# Patient Record
Sex: Male | Born: 1975 | Race: White | Hispanic: No | Smoking: Never smoker
Health system: Southern US, Community
[De-identification: ages and names within clinical notes are randomized; demographics above are authoritative.]

## PROBLEM LIST (undated history)

## (undated) DIAGNOSIS — F900 Attention-deficit hyperactivity disorder, predominantly inattentive type: Secondary | ICD-10-CM

## (undated) DIAGNOSIS — I10 Essential (primary) hypertension: Secondary | ICD-10-CM

## (undated) DIAGNOSIS — E785 Hyperlipidemia, unspecified: Secondary | ICD-10-CM

## (undated) DIAGNOSIS — E559 Vitamin D deficiency, unspecified: Secondary | ICD-10-CM

## (undated) DIAGNOSIS — K8020 Calculus of gallbladder without cholecystitis without obstruction: Secondary | ICD-10-CM

## (undated) HISTORY — DX: Essential (primary) hypertension: I10

## (undated) HISTORY — DX: Calculus of gallbladder without cholecystitis without obstruction: K80.20

## (undated) HISTORY — DX: Attention-deficit hyperactivity disorder, predominantly inattentive type: F90.0

## (undated) HISTORY — DX: Vitamin D deficiency, unspecified: E55.9

## (undated) HISTORY — DX: Hyperlipidemia, unspecified: E78.5

---

## 2010-11-11 ENCOUNTER — Encounter: Payer: Self-pay | Admitting: Internal Medicine

## 2010-11-11 ENCOUNTER — Ambulatory Visit (INDEPENDENT_AMBULATORY_CARE_PROVIDER_SITE_OTHER): Payer: BC Managed Care – PPO | Admitting: Internal Medicine

## 2010-11-11 ENCOUNTER — Other Ambulatory Visit: Payer: BC Managed Care – PPO

## 2010-11-11 ENCOUNTER — Other Ambulatory Visit: Payer: Self-pay | Admitting: Internal Medicine

## 2010-11-11 DIAGNOSIS — F411 Generalized anxiety disorder: Secondary | ICD-10-CM | POA: Insufficient documentation

## 2010-11-11 DIAGNOSIS — Z0000 Encounter for general adult medical examination without abnormal findings: Secondary | ICD-10-CM

## 2010-11-11 LAB — LIPID PANEL
HDL: 37.1 mg/dL — ABNORMAL LOW (ref >39.00)
LDL Cholesterol: 129 mg/dL — ABNORMAL HIGH (ref 0–99)
Total CHOL/HDL Ratio: 5
Triglycerides: 95 mg/dL (ref 0.0–149.0)
VLDL: 19 mg/dL (ref 0.0–40.0)

## 2010-11-12 ENCOUNTER — Encounter: Payer: Self-pay | Admitting: Internal Medicine

## 2010-11-12 DIAGNOSIS — F329 Major depressive disorder, single episode, unspecified: Secondary | ICD-10-CM | POA: Insufficient documentation

## 2010-11-12 DIAGNOSIS — F3289 Other specified depressive episodes: Secondary | ICD-10-CM | POA: Insufficient documentation

## 2010-11-17 NOTE — Assessment & Plan Note (Signed)
Summary: New Cpx/bcbs/--will fast 6 hrs prior--pt's request   Vital Signs:  Patient profile:   35 year old male Height:      73 inches Weight:      210 pounds BMI:     27.81 O2 Sat:      97 % on Room air Temp:     98.7 degrees F oral Pulse rate:   82 / minute Pulse rhythm:   regular Resp:     16 per minute BP sitting:   122 / 74  (left arm) Cuff size:   large  Vitals Entered By: Rock Nephew CMA (November 11, 2010 4:34 PM)  Nutrition Counseling: Patient's BMI is greater than 25 and therefore counseled on weight management options.  O2 Flow:  Room air  Primary Care Provider:  Etta Grandchild MD   History of Present Illness: New to me he comes in for a complete physical but also complains of anxiety and panic. He tells me that he is under a lot of stress at work and is separated from his wife.  Preventive Screening-Counseling & Management  Alcohol-Tobacco     Alcohol drinks/day: <1     Alcohol type: beer     >5/day in last 3 mos: no     Alcohol Counseling: not indicated; use of alcohol is not excessive or problematic     Feels need to cut down: no     Feels annoyed by complaints: no     Feels guilty re: drinking: no     Needs 'eye opener' in am: no     Smoking Status: never     Tobacco Counseling: not indicated; no tobacco use  Caffeine-Diet-Exercise     Does Patient Exercise: yes  Hep-HIV-STD-Contraception     Hepatitis Risk: no risk noted     HIV Risk: no risk noted     STD Risk: no risk noted     Dental Visit-last 6 months yes     Dental Care Counseling: to seek dental care; no dental care within six months     TSE monthly: yes     Testicular SE Education/Counseling to perform regular STE      Sexual History:  currently monogamous.        Drug Use:  no.        Blood Transfusions:  no.    Current Medications (verified): 1)  None  Allergies (verified): No Known Drug Allergies  Past History:  Past Medical History: Depression- previously took  Effexor  Past Surgical History: Tonsillectomy  Family History: Family History Hypertension  Social History: Occupation: Tourist information centre manager Divorced Never Smoked Alcohol use-yes Drug use-no Regular exercise-yes Smoking Status:  never Hepatitis Risk:  no risk noted HIV Risk:  no risk noted STD Risk:  no risk noted Dental Care w/in 6 mos.:  yes Sexual History:  currently monogamous Blood Transfusions:  no Drug Use:  no Does Patient Exercise:  yes  Review of Systems  The patient denies anorexia, fever, weight loss, weight gain, chest pain, syncope, dyspnea on exertion, peripheral edema, prolonged cough, headaches, hemoptysis, abdominal pain, hematuria, genital sores, suspicious skin lesions, transient blindness, difficulty walking, depression, unusual weight change, abnormal bleeding, enlarged lymph nodes, angioedema, and testicular masses.   Psych:  Complains of anxiety and panic attacks; denies alternate hallucination ( auditory/visual), depression, easily angered, easily tearful, irritability, mental problems, sense of great danger, suicidal thoughts/plans, thoughts of violence, unusual visions or sounds, and thoughts /plans of harming others.  Physical Exam  General:  alert, well-developed, well-nourished, well-hydrated, appropriate dress, normal appearance, healthy-appearing, cooperative to examination, and good hygiene.   Head:  normocephalic, atraumatic, no abnormalities observed, and no abnormalities palpated.   Eyes:  vision grossly intact, pupils equal, pupils round, and pupils reactive to light.   Ears:  R ear normal and L ear normal.   Nose:  External nasal examination shows no deformity or inflammation. Nasal mucosa are pink and moist without lesions or exudates. Mouth:  Oral mucosa and oropharynx without lesions or exudates.  Teeth in good repair. Neck:  No deformities, masses, or tenderness noted. Breasts:  No masses or gynecomastia noted Lungs:  Normal  respiratory effort, chest expands symmetrically. Lungs are clear to auscultation, no crackles or wheezes. Heart:  Normal rate and regular rhythm. S1 and S2 normal without gallop, murmur, click, rub or other extra sounds. Abdomen:  Bowel sounds positive,abdomen soft and non-tender without masses, organomegaly or hernias noted. Genitalia:  uncircumcised, no hydrocele, no varicocele, no scrotal masses, no testicular masses or atrophy, no cutaneous lesions, and no urethral discharge.   Msk:  No deformity or scoliosis noted of thoracic or lumbar spine.   Pulses:  R and L carotid,radial,femoral,dorsalis pedis and posterior tibial pulses are full and equal bilaterally Extremities:  No clubbing, cyanosis, edema, or deformity noted with normal full range of motion of all joints.   Neurologic:  No cranial nerve deficits noted. Station and gait are normal. Plantar reflexes are down-going bilaterally. DTRs are symmetrical throughout. Sensory, motor and coordinative functions appear intact. Skin:  turgor normal, color normal, no rashes, no suspicious lesions, no ecchymoses, no petechiae, no purpura, no ulcerations, no edema, and tattoo(s).   Cervical Nodes:  no anterior cervical adenopathy and no posterior cervical adenopathy.   Axillary Nodes:  no R axillary adenopathy and no L axillary adenopathy.   Inguinal Nodes:  no R inguinal adenopathy and no L inguinal adenopathy.   Psych:  Cognition and judgment appear intact. Alert and cooperative with normal attention span and concentration. No apparent delusions, illusions, hallucinations   Impression & Recommendations:  Problem # 1:  ROUTINE GENERAL MEDICAL EXAM@HEALTH  CARE FACL (ICD-V70.0) Assessment New  Orders: TLB-Lipid Panel (80061-LIPID)  Td Booster: Historical (03/13/2008)    Discussed using sunscreen, use of alcohol, drug use, self testicular exam, routine dental care, routine eye care, routine physical exam, seat belts, multiple vitamins,  and  recommendations for immunizations.  Discussed exercise and checking cholesterol.  Discussed gun safety, safe sex, and contraception.   Problem # 2:  OTHER ANXIETY STATES (ICD-300.09) Assessment: New  His updated medication list for this problem includes:    Klonopin 1 Mg Tabs (Clonazepam) ..... One by mouth two times a day as needed for panic/anxiety  Discussed medication use and relaxation techniques.   Complete Medication List: 1)  Klonopin 1 Mg Tabs (Clonazepam) .... One by mouth two times a day as needed for panic/anxiety  Patient Instructions: 1)  Please schedule a follow-up appointment in 1 month. 2)  It is important that you exercise regularly at least 20 minutes 5 times a week. If you develop chest pain, have severe difficulty breathing, or feel very tired , stop exercising immediately and seek medical attention. 3)  If you could be exposed to sexually transmitted diseases, you should use a condom. 4)  It is not healthy  for men to drink more than 2-3 drinks per day or for women to drink more than 1-2 drinks per day. Prescriptions: KLONOPIN 1 MG TABS (CLONAZEPAM)  One by mouth two times a day as needed for panic/anxiety  #35 x 2   Entered and Authorized by:   Etta Grandchild MD   Signed by:   Etta Grandchild MD on 11/11/2010   Method used:   Print then Give to Patient   RxID:   [id number]    Orders Added: 1)  TLB-Lipid Panel [80061-LIPID] 2)  New Patient 18-39 years [99385]   Immunization History:  Tetanus/Td Immunization History:    Tetanus/Td:  historical (03/13/2008)   Immunization History:  Tetanus/Td Immunization History:    Tetanus/Td:  Historical (03/13/2008)

## 2010-11-17 NOTE — Letter (Signed)
Summary: Lipid Letter  [facility]  [address] [place], [place] [address]   Phone: [phone]  Fax: [phone]    11/12/2010  Kyrese Gartman [address] [place], [place]  [address]  Dear Misty Stanley:  We have carefully reviewed your last lipid profile from 11/11/2010 and the results are noted below with a summary of recommendations for lipid management.    Cholesterol:       185     Goal: <200   HDL "good" Cholesterol:   02.72     Goal: >40   LDL "bad" Cholesterol:   129     Goal: <130   Triglycerides:       95.0     Goal: <150        TLC Diet (Therapeutic Lifestyle Change): Saturated Fats & Transfatty acids should be kept < 7% of total calories ***Reduce Saturated Fats Polyunstaurated Fat can be up to 10% of total calories Monounsaturated Fat Fat can be up to 20% of total calories Total Fat should be no greater than 25-35% of total calories Carbohydrates should be 50-60% of total calories Protein should be approximately 15% of total calories Fiber should be at least 20-30 grams a day ***Increased fiber may help lower LDL Total Cholesterol should be < 200mg /day Consider adding plant stanol/sterols to diet (example: Benacol spread) ***A higher intake of unsaturated fat may reduce Triglycerides and Increase HDL    Adjunctive Measures (may lower LIPIDS and reduce risk of Heart Attack) include: Aerobic Exercise (20-30 minutes 3-4 times a week) Limit Alcohol Consumption Weight Reduction Aspirin 75-81 mg a day by mouth (if not allergic or contraindicated) Dietary Fiber 20-30 grams a day by mouth     Current Medications: 1)    Klonopin 1 Mg Tabs (Clonazepam) .... One by mouth two times a day as needed for panic/anxiety  If you have any questions, please call. We appreciate being able to work with you.   Sincerely,    [facility] Etta Grandchild MD

## 2013-08-02 ENCOUNTER — Ambulatory Visit (INDEPENDENT_AMBULATORY_CARE_PROVIDER_SITE_OTHER): Payer: BC Managed Care – PPO | Admitting: Internal Medicine

## 2013-08-02 ENCOUNTER — Other Ambulatory Visit (INDEPENDENT_AMBULATORY_CARE_PROVIDER_SITE_OTHER): Payer: BC Managed Care – PPO

## 2013-08-02 ENCOUNTER — Encounter: Payer: Self-pay | Admitting: Internal Medicine

## 2013-08-02 VITALS — BP 136/98 | HR 88 | Temp 98.2°F | Resp 16 | Ht 73.0 in | Wt 221.0 lb

## 2013-08-02 DIAGNOSIS — Z0000 Encounter for general adult medical examination without abnormal findings: Secondary | ICD-10-CM

## 2013-08-02 DIAGNOSIS — Z1211 Encounter for screening for malignant neoplasm of colon: Secondary | ICD-10-CM | POA: Insufficient documentation

## 2013-08-02 DIAGNOSIS — F988 Other specified behavioral and emotional disorders with onset usually occurring in childhood and adolescence: Secondary | ICD-10-CM

## 2013-08-02 DIAGNOSIS — Z23 Encounter for immunization: Secondary | ICD-10-CM

## 2013-08-02 DIAGNOSIS — I10 Essential (primary) hypertension: Secondary | ICD-10-CM | POA: Insufficient documentation

## 2013-08-02 DIAGNOSIS — K5900 Constipation, unspecified: Secondary | ICD-10-CM

## 2013-08-02 DIAGNOSIS — F900 Attention-deficit hyperactivity disorder, predominantly inattentive type: Secondary | ICD-10-CM

## 2013-08-02 HISTORY — DX: Essential (primary) hypertension: I10

## 2013-08-02 HISTORY — DX: Attention-deficit hyperactivity disorder, predominantly inattentive type: F90.0

## 2013-08-02 LAB — LIPID PANEL
Cholesterol: 216 mg/dL — ABNORMAL HIGH (ref 0–200)
VLDL: 24 mg/dL (ref 0.0–40.0)

## 2013-08-02 LAB — COMPREHENSIVE METABOLIC PANEL
AST: 16 U/L (ref 0–37)
Albumin: 4.5 g/dL (ref 3.5–5.2)
BUN: 12 mg/dL (ref 6–23)
CO2: 26 mEq/L (ref 19–32)
Calcium: 9.5 mg/dL (ref 8.4–10.5)
Chloride: 102 mEq/L (ref 96–112)
Creatinine, Ser: 1.1 mg/dL (ref 0.4–1.5)
GFR: 83.61 mL/min (ref >60.00)
Potassium: 4.1 mEq/L (ref 3.5–5.1)

## 2013-08-02 LAB — URINALYSIS, ROUTINE W REFLEX MICROSCOPIC
Hgb urine dipstick: NEGATIVE
Leukocytes, UA: NEGATIVE
Nitrite: NEGATIVE
RBC / HPF: NONE SEEN (ref 0–?)
Total Protein, Urine: NEGATIVE
Urine Glucose: NEGATIVE
pH: 7 (ref 5.0–8.0)

## 2013-08-02 LAB — CBC WITH DIFFERENTIAL/PLATELET
Basophils Relative: 0.3 % (ref 0.0–3.0)
Eosinophils Absolute: 0.4 10*3/uL (ref 0.0–0.7)
Hemoglobin: 16.2 g/dL (ref 13.0–17.0)
Lymphocytes Relative: 33.9 % (ref 12.0–46.0)
MCHC: 33.8 g/dL (ref 30.0–36.0)
MCV: 89.7 fl (ref 78.0–100.0)
Neutro Abs: 4.5 10*3/uL (ref 1.4–7.7)
RBC: 5.34 Mil/uL (ref 4.22–5.81)

## 2013-08-02 LAB — LDL CHOLESTEROL, DIRECT: Direct LDL: 173 mg/dL

## 2013-08-02 MED ORDER — LINACLOTIDE 145 MCG PO CAPS: 145.0000 ug | ORAL_CAPSULE | Freq: Every day | ORAL | Status: DC

## 2013-08-02 MED ORDER — LISDEXAMFETAMINE DIMESYLATE 40 MG PO CAPS: 40.0000 mg | ORAL_CAPSULE | ORAL | Status: DC

## 2013-08-02 NOTE — Progress Notes (Signed)
Pre visit review using our clinic review tool, if applicable. No additional management support is needed unless otherwise documented below in the visit note. 

## 2013-08-02 NOTE — Patient Instructions (Signed)
Constipation, Adult Constipation is when a person has fewer than 3 bowel movements a week; has difficulty having a bowel movement; or has stools that are dry, hard, or larger than normal. As people grow older, constipation is more common. If you try to fix constipation with medicines that make you have a bowel movement (laxatives), the problem may get worse. Long-term laxative use may cause the muscles of the colon to become weak. A low-fiber diet, not taking in enough fluids, and taking certain medicines may make constipation worse. CAUSES   Certain medicines, such as antidepressants, pain medicine, iron supplements, antacids, and water pills.   Certain diseases, such as diabetes, irritable bowel syndrome (IBS), thyroid disease, or depression.   Not drinking enough water.   Not eating enough fiber-rich foods.   Stress or travel.  Lack of physical activity or exercise.  Not going to the restroom when there is the urge to have a bowel movement.  Ignoring the urge to have a bowel movement.  Using laxatives too much. SYMPTOMS   Having fewer than 3 bowel movements a week.   Straining to have a bowel movement.   Having hard, dry, or larger than normal stools.   Feeling full or bloated.   Pain in the lower abdomen.  Not feeling relief after having a bowel movement. DIAGNOSIS  Your caregiver will take a medical history and perform a physical exam. Further testing may be done for severe constipation. Some tests may include:   A barium enema X-ray to examine your rectum, colon, and sometimes, your small intestine.  A sigmoidoscopy to examine your lower colon.  A colonoscopy to examine your entire colon. TREATMENT  Treatment will depend on the severity of your constipation and what is causing it. Some dietary treatments include drinking more fluids and eating more fiber-rich foods. Lifestyle treatments may include regular exercise. If these diet and lifestyle recommendations  do not help, your caregiver may recommend taking over-the-counter laxative medicines to help you have bowel movements. Prescription medicines may be prescribed if over-the-counter medicines do not work.  HOME CARE INSTRUCTIONS   Increase dietary fiber in your diet, such as fruits, vegetables, whole grains, and beans. Limit high-fat and processed sugars in your diet, such as [place] fries, hamburgers, cookies, candies, and soda.   A fiber supplement may be added to your diet if you cannot get enough fiber from foods.   Drink enough fluids to keep your urine clear or pale yellow.   Exercise regularly or as directed by your caregiver.   Go to the restroom when you have the urge to go. Do not hold it.  Only take medicines as directed by your caregiver. Do not take other medicines for constipation without talking to your caregiver first. SEEK IMMEDIATE MEDICAL CARE IF:   You have bright red blood in your stool.   Your constipation lasts for more than 4 days or gets worse.   You have abdominal or rectal pain.   You have thin, pencil-like stools.  You have unexplained weight loss. MAKE SURE YOU:   Understand these instructions.  Will watch your condition.  Will get help right away if you are not doing well or get worse. Document Released: 05/14/2004 Document Revised: 11/08/2011 Document Reviewed: 07/20/2011 [facility] Patient Information 2014 [place], [place]. Health Maintenance, Males A healthy lifestyle and preventative care can promote health and wellness.  Maintain regular health, dental, and eye exams.  Eat a healthy diet. Foods like vegetables, fruits, whole grains, low-fat dairy products,  and lean protein foods contain the nutrients you need without too many calories. Decrease your intake of foods high in solid fats, added sugars, and salt. Get information about a proper diet from your caregiver, if necessary.  Regular physical exercise is one of the most important things  you can do for your health. Most adults should get at least 150 minutes of moderate-intensity exercise (any activity that increases your heart rate and causes you to sweat) each week. In addition, most adults need muscle-strengthening exercises on 2 or more days a week.   Maintain a healthy weight. The body mass index (BMI) is a screening tool to identify possible weight problems. It provides an estimate of body fat based on height and weight. Your caregiver can help determine your BMI, and can help you achieve or maintain a healthy weight. For adults 20 years and older:  A BMI below 18.5 is considered underweight.  A BMI of 18.5 to 24.9 is normal.  A BMI of 25 to 29.9 is considered overweight.  A BMI of 30 and above is considered obese.  Maintain normal blood lipids and cholesterol by exercising and minimizing your intake of saturated fat. Eat a balanced diet with plenty of fruits and vegetables. Blood tests for lipids and cholesterol should begin at age 28 and be repeated every 5 years. If your lipid or cholesterol levels are high, you are over 50, or you are a high risk for heart disease, you may need your cholesterol levels checked more frequently.Ongoing high lipid and cholesterol levels should be treated with medicines, if diet and exercise are not effective.  If you smoke, find out from your caregiver how to quit. If you do not use tobacco, do not start.  Lung cancer screening is recommended for adults aged 54 80 years who are at high risk for developing lung cancer because of a history of smoking. Yearly low-dose computed tomography (CT) is recommended for people who have at least a 30-pack-year history of smoking and are a current smoker or have quit within the past 15 years. A pack year of smoking is smoking an average of 1 pack of cigarettes a day for 1 year (for example: 1 pack a day for 30 years or 2 packs a day for 15 years). Yearly screening should continue until the smoker has  stopped smoking for at least 15 years. Yearly screening should also be stopped for people who develop a health problem that would prevent them from having lung cancer treatment.  If you choose to drink alcohol, do not exceed 2 drinks per day. One drink is considered to be 12 ounces (355 mL) of beer, 5 ounces (148 mL) of wine, or 1.5 ounces (44 mL) of liquor.  Avoid use of street drugs. Do not share needles with anyone. Ask for help if you need support or instructions about stopping the use of drugs.  High blood pressure causes heart disease and increases the risk of stroke. Blood pressure should be checked at least every 1 to 2 years. Ongoing high blood pressure should be treated with medicines if weight loss and exercise are not effective.  If you are 11 to 36 years old, ask your caregiver if you should take aspirin to prevent heart disease.  Diabetes screening involves taking a blood sample to check your fasting blood sugar level. This should be done once every 3 years, after age 36, if you are within normal weight and without risk factors for diabetes. Testing should be considered  at a younger age or be carried out more frequently if you are overweight and have at least 1 risk factor for diabetes.  Colorectal cancer can be detected and often prevented. Most routine colorectal cancer screening begins at the age of 60 and continues through age 40. However, your caregiver may recommend screening at an earlier age if you have risk factors for colon cancer. On a yearly basis, your caregiver may provide home test kits to check for hidden blood in the stool. Use of a small camera at the end of a tube, to directly examine the colon (sigmoidoscopy or colonoscopy), can detect the earliest forms of colorectal cancer. Talk to your caregiver about this at age 83, when routine screening begins. Direct examination of the colon should be repeated every 5 to 10 years through age 45, unless early forms of pre-cancerous  polyps or small growths are found.  Hepatitis C blood testing is recommended for all people born from [birth year] through 1964 and any individual with known risks for hepatitis C.  Healthy men should no longer receive prostate-specific antigen (PSA) blood tests as part of routine cancer screening. Consult with your caregiver about prostate cancer screening.  Testicular cancer screening is not recommended for adolescents or adult males who have no symptoms. Screening includes self-exam, caregiver exam, and other screening tests. Consult with your caregiver about any symptoms you have or any concerns you have about testicular cancer.  Practice safe sex. Use condoms and avoid high-risk sexual practices to reduce the spread of sexually transmitted infections (STIs).  Use sunscreen. Apply sunscreen liberally and repeatedly throughout the day. You should seek shade when your shadow is shorter than you. Protect yourself by wearing long sleeves, pants, a wide-brimmed hat, and sunglasses year round, whenever you are outdoors.  Notify your caregiver of new moles or changes in moles, especially if there is a change in shape or color. Also notify your caregiver if a mole is larger than the size of a pencil eraser.  A one-time screening for abdominal aortic aneurysm (AAA) and surgical repair of large AAAs by sound wave imaging (ultrasonography) is recommended for ages 52 to 56 years who are current or former smokers.  Stay current with your immunizations. Document Released: 02/12/2008 Document Revised: 12/11/2012 Document Reviewed: 01/11/2011 [facility] Patient Information 2014 [place], [place].

## 2013-08-03 NOTE — Assessment & Plan Note (Signed)
He has taken vyvanse in the past with good response Will try it again

## 2013-08-03 NOTE — Assessment & Plan Note (Signed)
Exam done Vaccines were updated Labs ordered Pt ed material was given 

## 2013-08-03 NOTE — Assessment & Plan Note (Signed)
His BP is borderline elevated  He will work on his lifestyle modifications

## 2013-08-03 NOTE — Assessment & Plan Note (Signed)
This sounds like idiopathic constipation I will check his labs today to look for secondary causes He will try linzess for symptom relief

## 2013-08-03 NOTE — Progress Notes (Signed)
   Subjective:    Patient ID: Leon Carroll, male    DOB: 1975/11/21, 37 y.o.   MRN: [id number]  Constipation This is a chronic problem. The current episode started more than 1 year ago ("all my life"). The problem is unchanged. His stool frequency is 2 to 3 times per week. The stool is described as firm and formed. The patient is on a high fiber diet. He exercises regularly. There has been adequate water intake. Pertinent negatives include no abdominal pain, anorexia, back pain, bloating, diarrhea, difficulty urinating, fecal incontinence, fever, flatus, hematochezia, hemorrhoids, melena, nausea, rectal pain, vomiting or weight loss. He has tried laxatives for the symptoms. The treatment provided mild relief.      Review of Systems  Constitutional: Negative.  Negative for fever, chills, weight loss, diaphoresis, activity change, appetite change, fatigue and unexpected weight change.  HENT: Negative.   Eyes: Negative.   Respiratory: Negative.  Negative for apnea, cough, choking, chest tightness, shortness of breath, wheezing and stridor.   Cardiovascular: Negative.  Negative for chest pain, palpitations and leg swelling.  Gastrointestinal: Positive for constipation. Negative for nausea, vomiting, abdominal pain, diarrhea, blood in stool, melena, hematochezia, abdominal distention, anal bleeding, rectal pain, bloating, anorexia, flatus and hemorrhoids.  Endocrine: Negative.   Genitourinary: Negative.  Negative for difficulty urinating.  Musculoskeletal: Negative.  Negative for back pain.  Skin: Negative.   Allergic/Immunologic: Negative.   Neurological: Negative.   Hematological: Negative.  Negative for adenopathy. Does not bruise/bleed easily.  Psychiatric/Behavioral: Negative.        Objective:   Physical Exam  Vitals reviewed. Constitutional: He is oriented to person, place, and time. He appears well-developed and well-nourished. No distress.  HENT:  Head: Normocephalic and  atraumatic.  Mouth/Throat: Oropharynx is clear and moist. No oropharyngeal exudate.  Eyes: Conjunctivae are normal. Right eye exhibits no discharge. Left eye exhibits no discharge. No scleral icterus.  Neck: Normal range of motion. Neck supple. No JVD present. No tracheal deviation present. No thyromegaly present.  Cardiovascular: Normal rate, regular rhythm, normal heart sounds and intact distal pulses.  Exam reveals no gallop and no friction rub.   No murmur heard. Pulmonary/Chest: Effort normal and breath sounds normal. No stridor. No respiratory distress. He has no wheezes. He has no rales. He exhibits no tenderness.  Abdominal: Soft. Bowel sounds are normal. He exhibits no distension and no mass. There is no tenderness. There is no rebound and no guarding. Hernia confirmed negative in the right inguinal area and confirmed negative in the left inguinal area.  Genitourinary: Testes normal and penis normal. Right testis shows no mass, no swelling and no tenderness. Right testis is descended. Left testis shows no mass, no swelling and no tenderness. Left testis is descended. Circumcised. No penile erythema or penile tenderness. No discharge found.  Musculoskeletal: Normal range of motion. He exhibits no edema and no tenderness.  Lymphadenopathy:    He has no cervical adenopathy.       Right: No inguinal adenopathy present.       Left: No inguinal adenopathy present.  Neurological: He is oriented to person, place, and time.  Skin: Skin is warm and dry. No rash noted. He is not diaphoretic. No erythema. No pallor.  Psychiatric: He has a normal mood and affect. His behavior is normal. Judgment and thought content normal.          Assessment & Plan:

## 2013-09-20 ENCOUNTER — Telehealth: Payer: Self-pay | Admitting: Internal Medicine

## 2013-09-20 DIAGNOSIS — F900 Attention-deficit hyperactivity disorder, predominantly inattentive type: Secondary | ICD-10-CM

## 2013-09-20 MED ORDER — LISDEXAMFETAMINE DIMESYLATE 40 MG PO CAPS: 40.0000 mg | ORAL_CAPSULE | ORAL | Status: DC

## 2013-09-20 NOTE — Telephone Encounter (Signed)
done

## 2013-09-20 NOTE — Telephone Encounter (Signed)
Patient called stating he need a refill on lisdexamfetamine (VYVANSE) 40 MG capsule Please advise

## 2013-09-27 NOTE — Telephone Encounter (Signed)
Pt called to check up on med request. Pt aware, med is ready be pick.

## 2013-10-09 ENCOUNTER — Encounter: Payer: Self-pay | Admitting: Internal Medicine

## 2013-11-02 ENCOUNTER — Encounter: Payer: Self-pay | Admitting: Internal Medicine

## 2013-11-13 ENCOUNTER — Telehealth: Payer: Self-pay | Admitting: Internal Medicine

## 2013-11-13 NOTE — Telephone Encounter (Signed)
Patient is calling to request a refill on his Vyvanse prescription. He is also wanting to know if he can have a prescription for Hydroxyzine to help him sleep. States that the Vyvanse keeps him up all night and he was prescribed Hydroxyzine a few years ago and just recently started to take it and it worked well with helping him sleep while taking Vyvanse. Please advise.

## 2013-11-14 NOTE — Telephone Encounter (Signed)
Pt need an appointment in order to discuss concerns

## 2013-11-14 NOTE — Telephone Encounter (Signed)
Left vm for pt to call and schedule an appt with Dr. Jones.  °

## 2013-11-14 NOTE — Telephone Encounter (Signed)
Patient scheduled for tomorrow

## 2013-11-15 ENCOUNTER — Encounter: Payer: Self-pay | Admitting: Internal Medicine

## 2013-11-15 ENCOUNTER — Ambulatory Visit (INDEPENDENT_AMBULATORY_CARE_PROVIDER_SITE_OTHER): Payer: BC Managed Care – PPO | Admitting: Internal Medicine

## 2013-11-15 VITALS — BP 118/82 | HR 78 | Temp 98.4°F | Resp 16 | Ht 73.0 in | Wt 223.2 lb

## 2013-11-15 DIAGNOSIS — G4700 Insomnia, unspecified: Secondary | ICD-10-CM

## 2013-11-15 DIAGNOSIS — F988 Other specified behavioral and emotional disorders with onset usually occurring in childhood and adolescence: Secondary | ICD-10-CM

## 2013-11-15 DIAGNOSIS — F900 Attention-deficit hyperactivity disorder, predominantly inattentive type: Secondary | ICD-10-CM

## 2013-11-15 MED ORDER — LISDEXAMFETAMINE DIMESYLATE 40 MG PO CAPS: 40.0000 mg | ORAL_CAPSULE | ORAL | Status: DC

## 2013-11-15 MED ORDER — HYDROXYZINE HCL 50 MG PO TABS: 50.0000 mg | ORAL_TABLET | Freq: Every evening | ORAL | Status: DC | PRN

## 2013-11-15 NOTE — Progress Notes (Signed)
   Subjective:    Patient ID: Leon LibelJames Stacey Courtois, male    DOB: 04/05/1976, 38 y.o.   MRN: [id number]  HPI Comments: He returns for f/up on ADHD - he is doing rather well though he does have difficulty with focus, project completion, and productivity at work if he misses a dose. Also, he occasionally has trouble falling asleep if he takes the dose later than 9 AM but he has some hydroxyzine that he has taken for that and it helps him fall asleep.     Review of Systems  Constitutional: Negative.  Negative for fever, chills, diaphoresis, appetite change and fatigue.  HENT: Negative.   Eyes: Negative.   Respiratory: Negative.  Negative for cough, choking, chest tightness, shortness of breath, wheezing and stridor.   Cardiovascular: Negative.  Negative for chest pain, palpitations and leg swelling.  Gastrointestinal: Negative.  Negative for nausea, vomiting, abdominal pain, diarrhea, constipation and blood in stool.  Endocrine: Negative.   Genitourinary: Negative.   Musculoskeletal: Negative.   Skin: Negative.   Allergic/Immunologic: Negative.   Neurological: Negative.  Negative for dizziness, tremors, weakness and light-headedness.  Hematological: Negative.  Negative for adenopathy. Does not bruise/bleed easily.  Psychiatric/Behavioral: Positive for sleep disturbance and decreased concentration. Negative for suicidal ideas, hallucinations, behavioral problems, confusion, self-injury, dysphoric mood and agitation. The patient is not nervous/anxious and is not hyperactive.        Objective:   Physical Exam  Vitals reviewed. Constitutional: He is oriented to person, place, and time. He appears well-developed and well-nourished. No distress.  HENT:  Head: Normocephalic and atraumatic.  Mouth/Throat: Oropharynx is clear and moist. No oropharyngeal exudate.  Eyes: Conjunctivae are normal. Right eye exhibits no discharge. Left eye exhibits no discharge. No scleral icterus.  Neck: Normal range  of motion. Neck supple. No JVD present. No tracheal deviation present. No thyromegaly present.  Cardiovascular: Normal rate, regular rhythm and intact distal pulses.  Exam reveals no gallop and no friction rub.   No murmur heard. Pulmonary/Chest: Effort normal and breath sounds normal. No stridor. No respiratory distress. He has no wheezes. He has no rales. He exhibits no tenderness.  Abdominal: Soft. Bowel sounds are normal. He exhibits no distension and no mass. There is no tenderness. There is no rebound and no guarding.  Musculoskeletal: Normal range of motion. He exhibits no edema and no tenderness.  Lymphadenopathy:    He has no cervical adenopathy.  Neurological: He is oriented to person, place, and time.  Skin: Skin is warm and dry. No rash noted. He is not diaphoretic. No erythema. No pallor.  Psychiatric: He has a normal mood and affect. His behavior is normal. Judgment and thought content normal.     Lab Results  Component Value Date   WBC 8.1 08/02/2013   HGB 16.2 08/02/2013   HCT 47.9 08/02/2013   PLT 245.0 08/02/2013   GLUCOSE 87 08/02/2013   CHOL 216* 08/02/2013   TRIG 120.0 08/02/2013   HDL 34.50* 08/02/2013   LDLDIRECT 173.0 08/02/2013   LDLCALC 129* 11/11/2010   ALT 13 08/02/2013   AST 16 08/02/2013   NA 136 08/02/2013   K 4.1 08/02/2013   CL 102 08/02/2013   CREATININE 1.1 08/02/2013   BUN 12 08/02/2013   CO2 26 08/02/2013   TSH 1.72 08/02/2013        Assessment & Plan:

## 2013-11-15 NOTE — Progress Notes (Signed)
Pre visit review using our clinic review tool, if applicable. No additional management support is needed unless otherwise documented below in the visit note. 

## 2013-11-15 NOTE — Patient Instructions (Signed)
Attention Deficit Hyperactivity Disorder Attention deficit hyperactivity disorder (ADHD) is a problem with behavior issues based on the way the brain functions (neurobehavioral disorder). It is a common reason for behavior and academic problems in school. SYMPTOMS  There are 3 types of ADHD. The 3 types and some of the symptoms include:  Inattentive  Gets bored or distracted easily.  Loses or forgets things. Forgets to hand in homework.  Has trouble organizing or completing tasks.  Difficulty staying on task.  An inability to organize daily tasks and school work.  Leaving projects, chores, or homework unfinished.  Trouble paying attention or responding to details. Careless mistakes.  Difficulty following directions. Often seems like is not listening.  Dislikes activities that require sustained attention (like chores or homework).  Hyperactive-impulsive  Feels like it is impossible to sit still or stay in a seat. Fidgeting with hands and feet.  Trouble waiting turn.  Talking too much or out of turn. Interruptive.  Speaks or acts impulsively.  Aggressive, disruptive behavior.  Constantly busy or on the go, noisy.  Often leaves seat when they are expected to remain seated.  Often runs or climbs where it is not appropriate, or feels very restless.  Combined  Has symptoms of both of the above. Often children with ADHD feel discouraged about themselves and with school. They often perform well below their abilities in school. As children get older, the excess motor activities can calm down, but the problems with paying attention and staying organized persist. Most children do not outgrow ADHD but with good treatment can learn to cope with the symptoms. DIAGNOSIS  When ADHD is suspected, the diagnosis should be made by professionals trained in ADHD. This professional will collect information about the individual suspected of having ADHD. Information must be collected from  various settings where the person lives, works, or attends school.  Diagnosis will include:  Confirming symptoms began in childhood.  Ruling out other reasons for the child's behavior.  The health care providers will check with the child's school and check their medical records.  They will talk to teachers and parents.  Behavior rating scales for the child will be filled out by those dealing with the child on a daily basis. A diagnosis is made only after all information has been considered. TREATMENT  Treatment usually includes behavioral treatment, tutoring or extra support in school, and stimulant medicines. Because of the way a person's brain works with ADHD, these medicines decrease impulsivity and hyperactivity and increase attention. This is different than how they would work in a person who does not have ADHD. Other medicines used include antidepressants and certain blood pressure medicines. Most experts agree that treatment for ADHD should address all aspects of the person's functioning. Along with medicines, treatment should include structured classroom management at school. Parents should reward good behavior, provide constant discipline, and limit-setting. Tutoring should be available for the child as needed. ADHD is a life-long condition. If untreated, the disorder can have long-term serious effects into adolescence and adulthood. HOME CARE INSTRUCTIONS   Often with ADHD there is a lot of frustration among family members dealing with the condition. Blame and anger are also feelings that are common. In many cases, because the problem affects the family as a whole, the entire family may need help. A therapist can help the family find better ways to handle the disruptive behaviors of the person with ADHD and promote change. If the person with ADHD is young, most of the therapist's work   is with the parents. Parents will learn techniques for coping with and improving their child's  behavior. Sometimes only the child with the ADHD needs counseling. Your health care providers can help you make these decisions.  Children with ADHD may need help learning how to organize. Some helpful tips include:  Keep routines the same every day from wake-up time to bedtime. Schedule all activities, including homework and playtime. Keep the schedule in a place where the person with ADHD will often see it. Mark schedule changes as far in advance as possible.  Schedule outdoor and indoor recreation.  Have a place for everything and keep everything in its place. This includes clothing, backpacks, and school supplies.  Encourage writing down assignments and bringing home needed books. Work with your child's teachers for assistance in organizing school work.  Offer your child a well-balanced diet. Breakfast that includes a balance of whole grains, protein and, fruits or vegetables is especially important for school performance. Children should avoid drinks with caffeine including:  Soft drinks.  Coffee.  Tea.  However, some older children (adolescents) may find these drinks helpful in improving their attention. Because it can also be common for adolescents with ADHD to become addicted to caffeine, talk with your health care provider about what is a safe amount of caffeine intake for your child.  Children with ADHD need consistent rules that they can understand and follow. If rules are followed, give small rewards. Children with ADHD often receive, and expect, criticism. Look for good behavior and praise it. Set realistic goals. Give clear instructions. Look for activities that can foster success and self-esteem. Make time for pleasant activities with your child. Give lots of affection.  Parents are their children's greatest advocates. Learn as much as possible about ADHD. This helps you become a stronger and better advocate for your child. It also helps you educate your child's teachers and  instructors if they feel inadequate in these areas. Parent support groups are often helpful. A national group with local chapters is called Children and Adults with Attention Deficit Hyperactivity Disorder (CHADD). SEEK MEDICAL CARE IF:  Your child has repeated muscle twitches, cough or speech outbursts.  Your child has sleep problems.  Your child has a marked loss of appetite.  Your child develops depression.  Your child has new or worsening behavioral problems.  Your child develops dizziness.  Your child has a racing heart.  Your child has stomach pains.  Your child develops headaches. SEEK IMMEDIATE MEDICAL CARE IF:  Your child has been diagnosed with depression or anxiety and the symptoms seem to be getting worse.  Your child has been depressed and suddenly appears to have increased energy or motivation.  You are worried that your child is having a bad reaction to a medication he or she is taking for ADHD. Document Released: 08/06/2002 Document Revised: 06/06/2013 Document Reviewed: 04/23/2013 [employer] Patient Information 2014 [employer].  

## 2013-11-16 ENCOUNTER — Encounter: Payer: Self-pay | Admitting: Internal Medicine

## 2013-11-16 NOTE — Assessment & Plan Note (Signed)
Will cont vyvanse at the current dose

## 2013-11-16 NOTE — Assessment & Plan Note (Signed)
Cont hydroxyzine as needed for insomnia

## 2014-04-29 ENCOUNTER — Ambulatory Visit: Payer: BC Managed Care – PPO | Admitting: Internal Medicine

## 2014-05-01 ENCOUNTER — Ambulatory Visit (INDEPENDENT_AMBULATORY_CARE_PROVIDER_SITE_OTHER): Payer: BC Managed Care – PPO | Admitting: Internal Medicine

## 2014-05-01 ENCOUNTER — Ambulatory Visit (INDEPENDENT_AMBULATORY_CARE_PROVIDER_SITE_OTHER)
Admission: RE | Admit: 2014-05-01 | Discharge: 2014-05-01 | Disposition: A | Discharge status: Home / Self Care | Payer: BC Managed Care – PPO | Source: Ambulatory Visit | Attending: Internal Medicine | Admitting: Internal Medicine

## 2014-05-01 ENCOUNTER — Encounter: Payer: Self-pay | Admitting: Internal Medicine

## 2014-05-01 VITALS — BP 120/80 | HR 73 | Temp 98.2°F | Wt 226.2 lb

## 2014-05-01 DIAGNOSIS — M545 Low back pain, unspecified: Secondary | ICD-10-CM

## 2014-05-01 MED ORDER — CYCLOBENZAPRINE HCL 5 MG PO TABS: ORAL_TABLET | ORAL | Status: DC

## 2014-05-01 MED ORDER — TRAMADOL HCL 50 MG PO TABS: 50.0000 mg | ORAL_TABLET | Freq: Three times a day (TID) | ORAL | Status: DC | PRN

## 2014-05-01 NOTE — Patient Instructions (Signed)
The best exercises for the low back include freestyle swimming, stretch aerobics, and yoga.Cybex & Nautilus machines rather than dead weights are better for the back.  Eat a low-fat diet with lots of fruits and vegetables, up to 7-9 servings per day. Consume less than 40 Grams (preferably ZERO) of sugar per day from foods & drinks with High Fructose Corn Syrup (HFCS) sugar as #1,2,3 or # 4 on label.Whole Foods, [employer] & [employer] do not carry products with HFCS.

## 2014-05-01 NOTE — Progress Notes (Signed)
Pre visit review using our clinic review tool, if applicable. No additional management support is needed unless otherwise documented below in the visit note. 

## 2014-05-01 NOTE — Progress Notes (Signed)
   Subjective:    Patient ID: Leon Carroll, male    DOB: 1976-01-13, 38 y.o.   MRN: [id number]  HPI  He has had upper lumbar area back pain for 8 weeks. There was no injury, trigger, repetitive motion prior to onset of symptoms  It's in the mid lumbar spine that radiates bilaterally anteriorly with equal intensity  It is worse when supine and improved standing.  He does not find it is aggravated by playing tennis  Aleve and heat have been of only minimal benefit    Review of Systems Fever, chills, sweats, or unexplained weight loss not present. Blurred vision , diplopia or vision loss absent. Vertigo, near syncope or imbalance denied. There is no numbness, tingling, or weakness in extremities.  No loss of control of bladder or bowels.  He is concerned about central weight gain. He does drink high fructose corn syrup sugar-containing beverages frequently      Objective:   Physical Exam  Pertinent positive findings include: He is tender to light percussion over the upper lumbar spine. He is able to lie flat and sit up without help. He has negative straight leg raising. Deep tendon reflexes, strength, and tone are all normal. Heel and toe gait normal.  General appearance :adequately nourished; in no distress. Eyes: No conjunctival inflammation or scleral icterus is present. Heart:  Normal rate and regular rhythm. S1 and S2 normal without gallop, murmur, click, rub or other extra sounds   Lungs:Chest clear to auscultation; no wheezes, rhonchi,rales ,or rubs present.No increased work of breathing.  Abdomen: bowel sounds normal, soft and non-tender without masses, organomegaly or hernias noted.  No guarding or rebound.  Skin:Warm & dry.  Intact without suspicious lesions or rashes ; no jaundice or tenting Lymphatic: No lymphadenopathy is noted about the head, neck, axilla.           Assessment & Plan:  #1 lumbar pain with radiculopathy bilaterally   Plan: See  orders and recommendations

## 2014-05-17 ENCOUNTER — Other Ambulatory Visit: Payer: Self-pay | Admitting: Internal Medicine

## 2014-05-17 DIAGNOSIS — M545 Low back pain, unspecified: Secondary | ICD-10-CM

## 2014-05-17 MED ORDER — CYCLOBENZAPRINE HCL 5 MG PO TABS: ORAL_TABLET | ORAL | Status: DC

## 2014-05-17 NOTE — Telephone Encounter (Signed)
#  10 1-2 qhs prn  If symptoms are persisting or progressing, further evaluation is needed rather than simply taking muscle relaxant

## 2014-05-17 NOTE — Telephone Encounter (Signed)
Patient states pharmacy is sending over a request for flexeril.  Patient will be out over the weekend so he would like something sent today.  [facility] uses [facility].

## 2014-05-17 NOTE — Telephone Encounter (Signed)
Received refill request for cyclobenzaprine 5 mg, do you want to refill? Please advise

## 2014-06-27 ENCOUNTER — Ambulatory Visit: Payer: BC Managed Care – PPO | Admitting: Internal Medicine

## 2014-07-01 ENCOUNTER — Ambulatory Visit (INDEPENDENT_AMBULATORY_CARE_PROVIDER_SITE_OTHER)
Admission: RE | Admit: 2014-07-01 | Discharge: 2014-07-01 | Disposition: A | Discharge status: Home / Self Care | Payer: BC Managed Care – PPO | Source: Ambulatory Visit | Attending: Family | Admitting: Family

## 2014-07-01 ENCOUNTER — Telehealth: Payer: Self-pay | Admitting: Family

## 2014-07-01 ENCOUNTER — Ambulatory Visit (INDEPENDENT_AMBULATORY_CARE_PROVIDER_SITE_OTHER): Payer: BC Managed Care – PPO | Admitting: Family

## 2014-07-01 ENCOUNTER — Encounter: Payer: Self-pay | Admitting: Family

## 2014-07-01 VITALS — BP 142/82 | HR 95 | Temp 98.6°F | Resp 18 | Ht 73.0 in | Wt 227.0 lb

## 2014-07-01 DIAGNOSIS — R059 Cough, unspecified: Secondary | ICD-10-CM

## 2014-07-01 DIAGNOSIS — R05 Cough: Secondary | ICD-10-CM | POA: Diagnosis not present

## 2014-07-01 MED ORDER — METHYLPREDNISOLONE (PAK) 4 MG PO TABS: ORAL_TABLET | ORAL | Status: DC

## 2014-07-01 MED ORDER — LEVOFLOXACIN 500 MG PO TABS: 500.0000 mg | ORAL_TABLET | Freq: Every day | ORAL | Status: DC

## 2014-07-01 MED ORDER — HYDROCODONE-HOMATROPINE 5-1.5 MG/5ML PO SYRP: 5.0000 mL | ORAL_SOLUTION | Freq: Three times a day (TID) | ORAL | Status: DC | PRN

## 2014-07-01 NOTE — Patient Instructions (Signed)
Thank you for choosing [facility].  Summary/Instructions:   Please go to radiology for an x-ray of your chest - we will be in contact with you regarding the results  Please start the antibiotic and take until completed.  Please be sure to wash your hands on a regularly  Pneumonia Pneumonia is an infection of the lungs.  CAUSES Pneumonia may be caused by bacteria or a virus. Usually, these infections are caused by breathing infectious particles into the lungs (respiratory tract). SIGNS AND SYMPTOMS   Cough.  Fever.  Chest pain.  Increased rate of breathing.  Wheezing.  Mucus production. DIAGNOSIS  If you have the common symptoms of pneumonia, your health care provider will typically confirm the diagnosis with a chest X-ray. The X-ray will show an abnormality in the lung (pulmonary infiltrate) if you have pneumonia. Other tests of your blood, urine, or sputum may be done to find the specific cause of your pneumonia. Your health care provider may also do tests (blood gases or pulse oximetry) to see how well your lungs are working. TREATMENT  Some forms of pneumonia may be spread to other people when you cough or sneeze. You may be asked to wear a mask before and during your exam. Pneumonia that is caused by bacteria is treated with antibiotic medicine. Pneumonia that is caused by the influenza virus may be treated with an antiviral medicine. Most other viral infections must run their course. These infections will not respond to antibiotics.  HOME CARE INSTRUCTIONS   Cough suppressants may be used if you are losing too much rest. However, coughing protects you by clearing your lungs. You should avoid using cough suppressants if you can.  Your health care provider may have prescribed medicine if he or she thinks your pneumonia is caused by bacteria or influenza. Finish your medicine even if you start to feel better.  Your health care provider may also prescribe an  expectorant. This loosens the mucus to be coughed up.  Take medicines only as directed by your health care provider.  Do not smoke. Smoking is a common cause of bronchitis and can contribute to pneumonia. If you are a smoker and continue to smoke, your cough may last several weeks after your pneumonia has cleared.  A cold steam vaporizer or humidifier in your room or home may help loosen mucus.  Coughing is often worse at night. Sleeping in a semi-upright position in a recliner or using a couple pillows under your head will help with this.  Get rest as you feel it is needed. Your body will usually let you know when you need to rest. PREVENTION A pneumococcal shot (vaccine) is available to prevent a common bacterial cause of pneumonia. This is usually suggested for:  People over 37 years old.  Patients on chemotherapy.  People with chronic lung problems, such as bronchitis or emphysema.  People with immune system problems. If you are over 65 or have a high risk condition, you may receive the pneumococcal vaccine if you have not received it before. In some countries, a routine influenza vaccine is also recommended. This vaccine can help prevent some cases of pneumonia.You may be offered the influenza vaccine as part of your care. If you smoke, it is time to quit. You may receive instructions on how to stop smoking. Your health care provider can provide medicines and counseling to help you quit. SEEK MEDICAL CARE IF: You have a fever. SEEK IMMEDIATE MEDICAL CARE IF:   Your illness  becomes worse. This is especially true if you are elderly or weakened from any other disease.  You cannot control your cough with suppressants and are losing sleep.  You begin coughing up blood.  You develop pain which is getting worse or is uncontrolled with medicines.  Any of the symptoms which initially brought you in for treatment are getting worse rather than better.  You develop shortness of breath  or chest pain. MAKE SURE YOU:   Understand these instructions.  Will watch your condition.  Will get help right away if you are not doing well or get worse. Document Released: 08/16/2005 Document Revised: 12/31/2013 Document Reviewed: 11/05/2010 [facility] Patient Information 2015 [place]. This information is not intended to replace advice given to you by your health care provider. Make sure you discuss any questions you have with your health care provider.

## 2014-07-01 NOTE — Telephone Encounter (Signed)
Please call the patient and let him know his x-ray is positive for pneumonia. Please continue to take the antibiotics that were prescribed. A medrol dose pack has been sent to the pharmacy.

## 2014-07-01 NOTE — Addendum Note (Signed)
Addended by: Mercer PodWRENN, Zidan Helget E on: 07/01/2014 04:58 PM   Modules accepted: Orders, Medications

## 2014-07-01 NOTE — Assessment & Plan Note (Addendum)
Symptoms and exam consistent with potential pneumonia. Obtain chest x-ray. Start levaquin and hycodan as needed. Advised of pneumonia symptoms and transmission. Will add steroid if PNA is confirmed on chest x-ray. Follow up if symptoms worsen or fail to improve.   Imaging Result:  IMPRESSION: Airspace consolidation over the right lower lobe compatible with a Pneumonia.  Continue Levofloxacin.

## 2014-07-01 NOTE — Telephone Encounter (Signed)
Best number to reach him is [phone] Wants result of xray an meds

## 2014-07-01 NOTE — Progress Notes (Signed)
Pre visit review using our clinic review tool, if applicable. No additional management support is needed unless otherwise documented below in the visit note. 

## 2014-07-01 NOTE — Telephone Encounter (Signed)
Called pt to let him know he has pneumonia and that a medrol dose pack was sent to pharmacy. He asked that I send it to another pharmacy that was closest to his house. [facility] [address]

## 2014-07-01 NOTE — Progress Notes (Signed)
   Subjective:    Patient ID: Leon Carroll, male    DOB: 1976/06/16, 38 y.o.   MRN: [id number]  Chief Complaint  Patient presents with  . Fever    has been having on and off fevers, cough, chills and body aches for a week    HPI:  Leon Carroll is a 38 y.o. male who presents today for an acute visit.   Acute symptoms started about a week ago. Currently experiencing cough,intermittent fever and occasional sore throat. Last fever was this morning. Denies sinus pressure. Cough is the worst of the symptoms and is severe enough to keep him awake at night. Does have a headache, but unsure if it is from coughing. Feels like he has to continue to drink water to prevent coughing attacks. There is nothing that makes it better or worse. Has attempted OTC Mucinex, Tylenol and Aleve. Has had bronchitis in the past.  No Known Allergies  No current outpatient prescriptions on file prior to visit.   No current facility-administered medications on file prior to visit.   No past medical history on file.  Family History  Problem Relation Age of Onset  . Bladder Cancer Father   . Hypertension Father   . Stroke Neg Hx   . Cancer Neg Hx   . Diabetes Neg Hx   . Early death Neg Hx   . Heart disease Neg Hx   . Hyperlipidemia Neg Hx   . Kidney disease Neg Hx   . Arthritis Neg Hx    Review of Systems    See HPI  Objective:    BP 142/82 mmHg  Pulse 95  Temp(Src) 98.6 F (37 C) (Oral)  Resp 18  Ht 6\' 1"  (1.854 m)  Wt 227 lb (102.967 kg)  BMI 29.96 kg/m2  SpO2 96% Nursing note and vital signs reviewed.  Physical Exam  Constitutional: He is oriented to person, place, and time. He appears well-developed and well-nourished.  HENT:  Head: Normocephalic.  Right Ear: Hearing, tympanic membrane, external ear and ear canal normal.  Left Ear: Hearing, tympanic membrane, external ear and ear canal normal.  Nose: Nose normal.  Mouth/Throat: Uvula is midline and mucous membranes are  normal. Posterior oropharyngeal erythema present.  Cardiovascular: Normal rate, regular rhythm and normal heart sounds.   Pulmonary/Chest: Effort normal. He has no decreased breath sounds. He has no wheezes. He has no rhonchi. He has rales in the right middle field and the right lower field.  Lymphadenopathy:    He has cervical adenopathy.  Neurological: He is alert and oriented to person, place, and time.  Skin: Skin is warm and dry.  Psychiatric: He has a normal mood and affect. His behavior is normal. Judgment and thought content normal.       Assessment & Plan:

## 2015-03-10 ENCOUNTER — Ambulatory Visit: Payer: Self-pay | Admitting: Internal Medicine

## 2015-03-13 ENCOUNTER — Ambulatory Visit (INDEPENDENT_AMBULATORY_CARE_PROVIDER_SITE_OTHER): Payer: BLUE CROSS/BLUE SHIELD | Admitting: Internal Medicine

## 2015-03-13 VITALS — BP 116/70 | HR 76 | Temp 98.2°F | Resp 16 | Ht 73.0 in | Wt 232.0 lb

## 2015-03-13 DIAGNOSIS — F900 Attention-deficit hyperactivity disorder, predominantly inattentive type: Secondary | ICD-10-CM

## 2015-03-13 MED ORDER — LISDEXAMFETAMINE DIMESYLATE 40 MG PO CAPS: 40.0000 mg | ORAL_CAPSULE | ORAL | Status: DC

## 2015-03-13 NOTE — Patient Instructions (Signed)

## 2015-03-13 NOTE — Progress Notes (Signed)
Pre visit review using our clinic review tool, if applicable. No additional management support is needed unless otherwise documented below in the visit note. 

## 2015-03-14 ENCOUNTER — Encounter: Payer: Self-pay | Admitting: Internal Medicine

## 2015-03-14 NOTE — Progress Notes (Signed)
Subjective:  Patient ID: Stefanie LibelJames Stacey Dobler, male    DOB: 06-Sep-1975  Age: 39 y.o. MRN: [id number]  CC: ADHD   HPI Raynelle JanJames Stacey Glosser presents for for treatment of ADHD. He complains of feeling disorganized, having scattered thoughts, having a lack of focus, making mistakes at work and constantly feeling distracted. He has previously been diagnosed and treated for ADHD.  Outpatient Prescriptions Prior to Visit  Medication Sig Dispense Refill  . HYDROcodone-homatropine (HYCODAN) 5-1.5 MG/5ML syrup Take 5 mLs by mouth every 8 (eight) hours as needed for cough. 120 mL 0  . levofloxacin (LEVAQUIN) 500 MG tablet Take 1 tablet (500 mg total) by mouth daily. 10 tablet 0  . methylPREDNIsolone (MEDROL DOSPACK) 4 MG tablet follow package directions 21 tablet 0   No facility-administered medications prior to visit.    ROS Review of Systems  Constitutional: Negative.  Negative for fever, chills, diaphoresis, appetite change and fatigue.  HENT: Negative.   Eyes: Negative.   Respiratory: Negative.  Negative for cough, choking, chest tightness, shortness of breath and stridor.   Cardiovascular: Negative.  Negative for chest pain, palpitations and leg swelling.  Gastrointestinal: Negative.  Negative for abdominal pain.  Endocrine: Negative.   Genitourinary: Negative.   Musculoskeletal: Negative.   Skin: Negative.   Allergic/Immunologic: Negative.   Neurological: Negative.   Hematological: Negative.  Negative for adenopathy. Does not bruise/bleed easily.  Psychiatric/Behavioral: Positive for decreased concentration. Negative for suicidal ideas, hallucinations, behavioral problems, confusion, sleep disturbance, self-injury, dysphoric mood and agitation. The patient is not nervous/anxious and is not hyperactive.     Objective:  BP 116/70 mmHg  Pulse 76  Temp(Src) 98.2 F (36.8 C) (Oral)  Ht 6\' 1"  (1.854 m)  Wt 232 lb (105.235 kg)  BMI 30.62 kg/m2  SpO2 97%  BP Readings from Last 3  Encounters:  03/13/15 116/70  07/01/14 142/82  05/01/14 120/80    Wt Readings from Last 3 Encounters:  03/13/15 232 lb (105.235 kg)  07/01/14 227 lb (102.967 kg)  05/01/14 226 lb 4 oz (102.626 kg)    Physical Exam  Constitutional: He is oriented to person, place, and time. No distress.  HENT:  Mouth/Throat: Oropharynx is clear and moist. No oropharyngeal exudate.  Eyes: Conjunctivae are normal. Right eye exhibits no discharge. Left eye exhibits no discharge. No scleral icterus.  Neck: Normal range of motion. Neck supple. No JVD present. No tracheal deviation present. No thyromegaly present.  Cardiovascular: Normal rate, regular rhythm, normal heart sounds and intact distal pulses.  Exam reveals no gallop and no friction rub.   No murmur heard. Pulmonary/Chest: Effort normal and breath sounds normal. No stridor. No respiratory distress. He has no wheezes. He has no rales. He exhibits no tenderness.  Abdominal: Soft. Bowel sounds are normal. He exhibits no distension and no mass. There is no tenderness. There is no rebound and no guarding.  Musculoskeletal: Normal range of motion. He exhibits no edema or tenderness.  Lymphadenopathy:    He has no cervical adenopathy.  Neurological: He is oriented to person, place, and time.  Skin: Skin is warm and dry. No rash noted. He is not diaphoretic. No erythema. No pallor.  Psychiatric: He has a normal mood and affect. His behavior is normal. Judgment and thought content normal. His mood appears not anxious. His speech is not rapid and/or pressured, not delayed and not tangential. He is not agitated, not aggressive and not slowed. Cognition and memory are normal. He does not exhibit a depressed mood.  Lab Results  Component Value Date   WBC 8.1 08/02/2013   HGB 16.2 08/02/2013   HCT 47.9 08/02/2013   PLT 245.0 08/02/2013   GLUCOSE 87 08/02/2013   CHOL 216* 08/02/2013   TRIG 120.0 08/02/2013   HDL 34.50* 08/02/2013   LDLDIRECT 173.0  08/02/2013   LDLCALC 129* 11/11/2010   ALT 13 08/02/2013   AST 16 08/02/2013   NA 136 08/02/2013   K 4.1 08/02/2013   CL 102 08/02/2013   CREATININE 1.1 08/02/2013   BUN 12 08/02/2013   CO2 26 08/02/2013   TSH 1.72 08/02/2013    Dg Chest 2 View  07/01/2014   CLINICAL DATA:  Cough and congestion with fever 7 days.  EXAM: CHEST  2 VIEW  COMPARISON:  None.  FINDINGS: Lungs are adequately inflated with airspace consolidation over the right lower lobe compatible with a pneumonia. No evidence of effusion. Cardiomediastinal silhouette is within normal. Bones and soft tissues are unremarkable.  IMPRESSION: Airspace consolidation over the right lower lobe compatible with a pneumonia.   Electronically Signed   By: Elberta Fortis M.D.   On: 07/01/2014 15:10    Assessment & Plan:   Alfred was seen today for adhd.  Diagnoses and all orders for this visit:  ADHD (attention deficit hyperactivity disorder), inattentive type Orders: -     Discontinue: lisdexamfetamine (VYVANSE) 40 MG capsule; Take 1 capsule (40 mg total) by mouth every morning. -     Discontinue: lisdexamfetamine (VYVANSE) 40 MG capsule; Take 1 capsule (40 mg total) by mouth every morning. -     Discontinue: lisdexamfetamine (VYVANSE) 40 MG capsule; Take 1 capsule (40 mg total) by mouth every morning. -     lisdexamfetamine (VYVANSE) 40 MG capsule; Take 1 capsule (40 mg total) by mouth every morning.   I have discontinued Mr. Yost HYDROcodone-homatropine, levofloxacin, and methylPREDNIsolone. I am also having him maintain his lisdexamfetamine.  Meds ordered this encounter  Medications  . DISCONTD: lisdexamfetamine (VYVANSE) 40 MG capsule    Sig: Take 1 capsule (40 mg total) by mouth every morning.    Dispense:  30 capsule    Refill:  0    FILL ON OR AFTER 03/13/15  . DISCONTD: lisdexamfetamine (VYVANSE) 40 MG capsule    Sig: Take 1 capsule (40 mg total) by mouth every morning.    Dispense:  30 capsule    Refill:  0     FILL ON OR AFTER 04/13/15  . DISCONTD: lisdexamfetamine (VYVANSE) 40 MG capsule    Sig: Take 1 capsule (40 mg total) by mouth every morning.    Dispense:  30 capsule    Refill:  0    FILL ON OR AFTER 05/14/15  . lisdexamfetamine (VYVANSE) 40 MG capsule    Sig: Take 1 capsule (40 mg total) by mouth every morning.    Dispense:  30 capsule    Refill:  0    FILL ON OR AFTER 06/13/15     Follow-up: Return in about 4 months (around 07/14/2015).  Sanda Linger, MD

## 2015-06-24 ENCOUNTER — Encounter: Payer: Self-pay | Admitting: Internal Medicine

## 2015-06-24 ENCOUNTER — Ambulatory Visit (INDEPENDENT_AMBULATORY_CARE_PROVIDER_SITE_OTHER): Payer: BLUE CROSS/BLUE SHIELD | Admitting: Internal Medicine

## 2015-06-24 VITALS — BP 128/84 | HR 88 | Temp 98.3°F | Wt 231.0 lb

## 2015-06-24 DIAGNOSIS — J0100 Acute maxillary sinusitis, unspecified: Secondary | ICD-10-CM

## 2015-06-24 DIAGNOSIS — R059 Cough, unspecified: Secondary | ICD-10-CM

## 2015-06-24 DIAGNOSIS — R05 Cough: Secondary | ICD-10-CM | POA: Diagnosis not present

## 2015-06-24 MED ORDER — AMOXICILLIN-POT CLAVULANATE 875-125 MG PO TABS: 1.0000 | ORAL_TABLET | Freq: Two times a day (BID) | ORAL | Status: DC

## 2015-06-24 MED ORDER — HYDROCODONE-HOMATROPINE 5-1.5 MG/5ML PO SYRP: 5.0000 mL | ORAL_SOLUTION | Freq: Four times a day (QID) | ORAL | Status: DC | PRN

## 2015-06-24 NOTE — Progress Notes (Signed)
Pre visit review using our clinic review tool, if applicable. No additional management support is needed unless otherwise documented below in the visit note. 

## 2015-06-24 NOTE — Patient Instructions (Signed)

## 2015-06-24 NOTE — Progress Notes (Signed)
   Subjective:    Patient ID: Leon Carroll, male    DOB: 1975-09-19, 39 y.o.   MRN: [id number]  HPI On 10/18 he began to experience head congestion, swelling in throat, sore throat and cough intermittently productive of green sputum. Today he had green/yellow nasal discharge. He also had pain in the maxillary sinuses up to level III on a 10 scale. He's had some anosmia. Also describes hoarseness. He did have some sensation of being "winded" on 10/23 after taking the trash out but has no other pulmonary symptoms. The coughing did disturb sleep.  Review of Systems Frontal headache, nasal purulence, dental pain, sore throat , otic pain or otic discharge denied. No fever , chills or sweats. Extrinsic symptoms of itchy, watery eyes, sneezing, or angioedema are denied. There is no  Wheezing or  paroxysmal nocturnal dyspnea.    Objective:   Physical Exam  General appearance:Adequately nourished; no acute distress or increased work of breathing is present.    Lymphatic: No  lymphadenopathy about the head, neck, or axilla .  Eyes: No conjunctival inflammation or lid edema is present. There is no scleral icterus.  Ears:  External ear exam shows no significant lesions or deformities.  Otoscopic examination reveals clear canals, tympanic membranes are intact bilaterally without bulging, retraction, inflammation or discharge.  Nose:  External nasal examination shows no deformity or inflammation. Nasal mucosa are erythematous  without lesions or exudates No septal dislocation or deviation.No obstruction to airflow.   Oral exam: Dental hygiene is good; lips and gums are healthy appearing.There is no oropharyngeal erythema or exudate .  Neck:  No deformities, thyromegaly, masses, or tenderness noted.   Supple with full range of motion without pain.   Heart:  Normal rate and regular rhythm. S1 and S2 normal without gallop, murmur, click, rub or other extra sounds.   Lungs:Chest clear to  auscultation; no wheezes, rhonchi,rales ,or rubs present.  Extremities:  No cyanosis, edema, or clubbing  noted   Skin: Warm & dry w/o tenting or jaundice. No significant lesions or rash.     Assessment & Plan:  #1 rhinosinusitis with bronchitis vs rhinitis induced cough  Plan: Nasal hygiene interventions discussed. See prescription medications

## 2015-09-02 IMAGING — CR DG CHEST 2V
2 series · 2 of 2 positions shown · non-contrast
Comparison: None.

CLINICAL DATA: Cough and congestion with fever 7 days.

EXAM:
CHEST  2 VIEW

[view not recorded (1 of 2)]
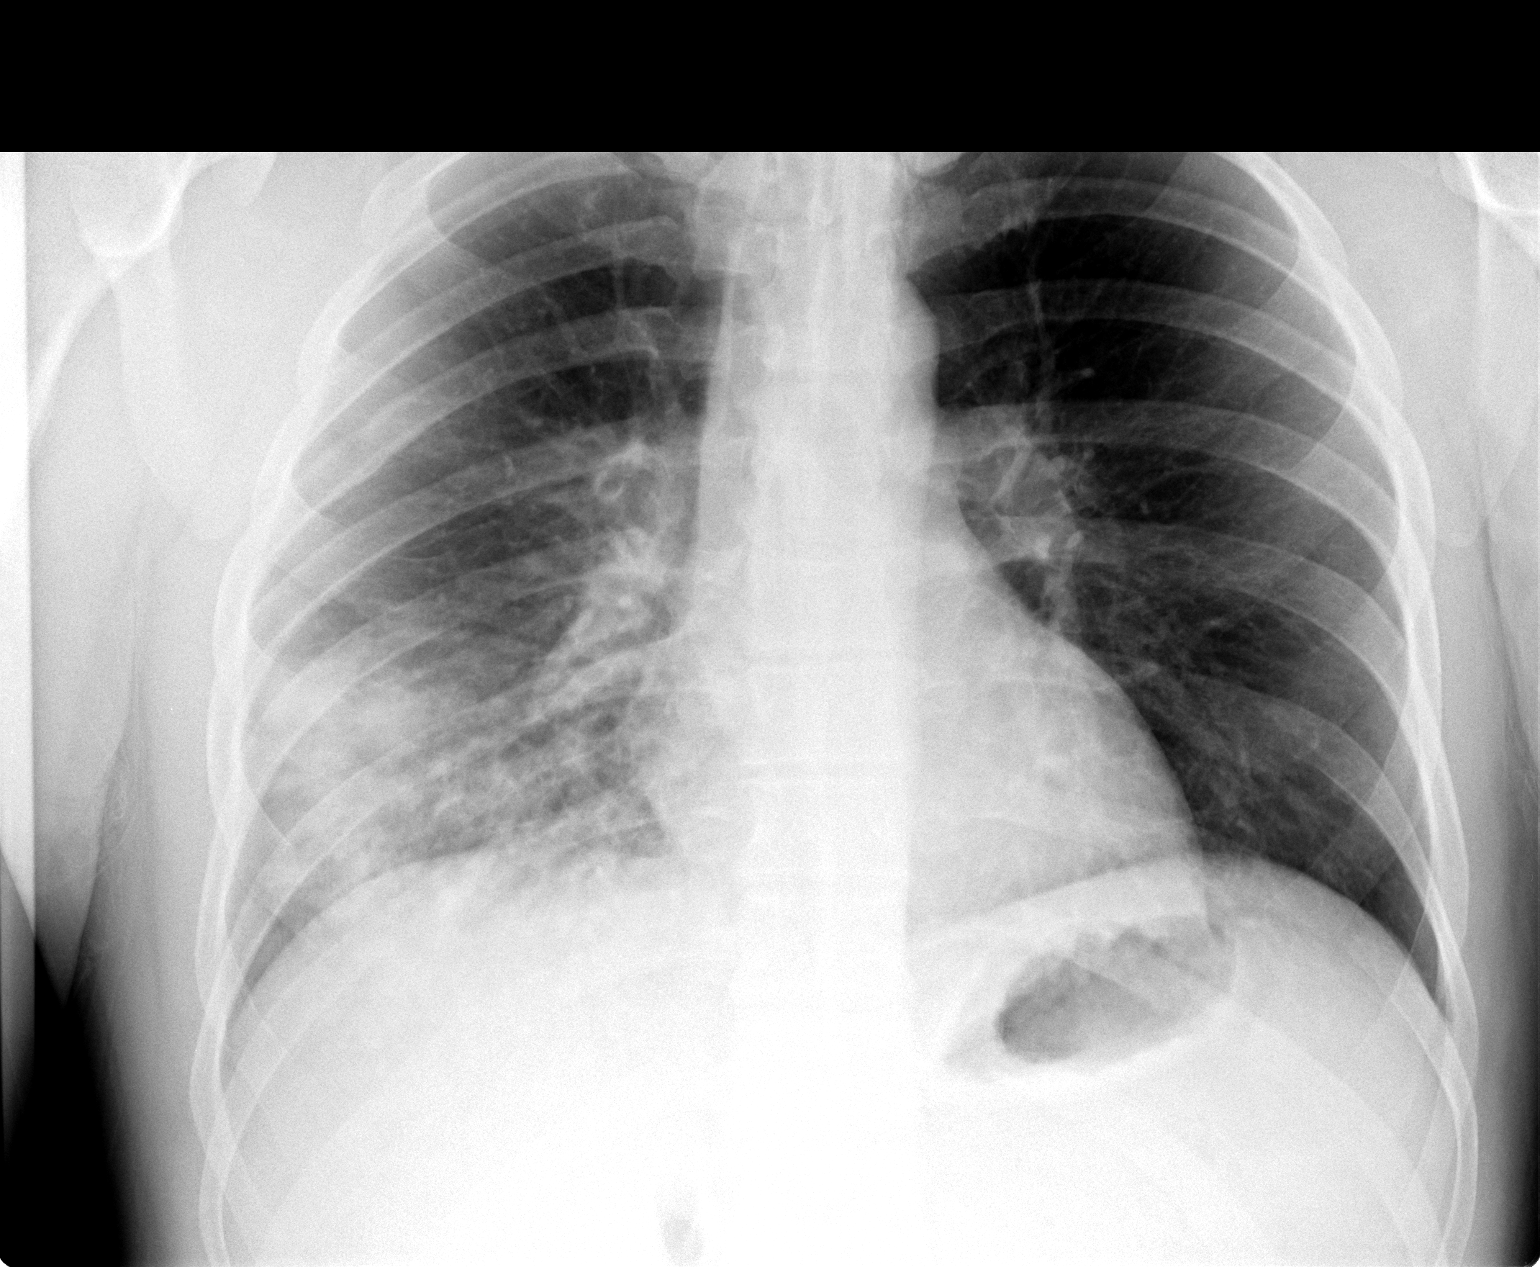

[view not recorded (2 of 2)]
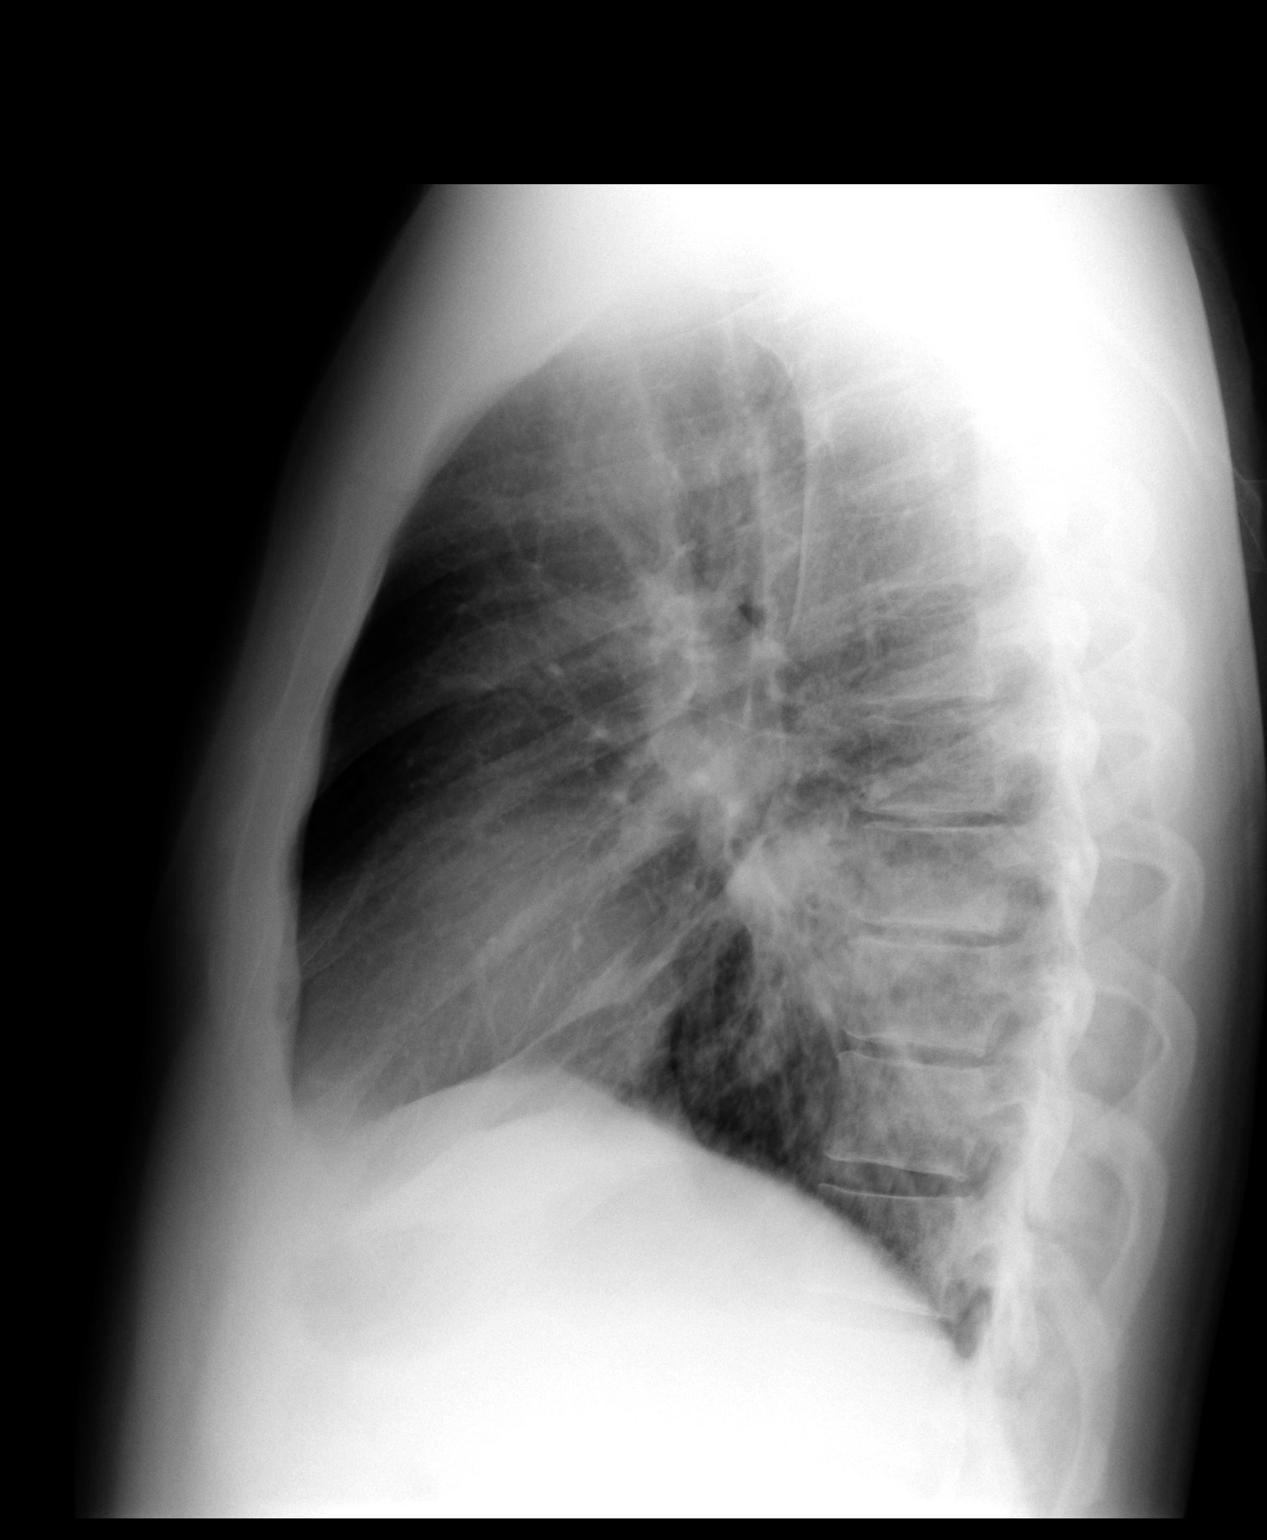

[2 of 2 positions shown; findings below may reference images not displayed]

FINDINGS: Lungs are adequately inflated with airspace consolidation over the
right lower lobe compatible with a pneumonia. No evidence of
effusion. Cardiomediastinal silhouette is within normal. Bones and
soft tissues are unremarkable.
IMPRESSION: Airspace consolidation over the right lower lobe compatible with a
pneumonia.

## 2015-09-19 ENCOUNTER — Encounter: Payer: Self-pay | Admitting: Internal Medicine

## 2015-09-19 ENCOUNTER — Telehealth: Payer: Self-pay | Admitting: Internal Medicine

## 2015-09-19 ENCOUNTER — Ambulatory Visit (INDEPENDENT_AMBULATORY_CARE_PROVIDER_SITE_OTHER): Payer: BLUE CROSS/BLUE SHIELD | Admitting: Internal Medicine

## 2015-09-19 VITALS — BP 144/92 | HR 94 | Temp 98.5°F | Ht 73.0 in | Wt 237.0 lb

## 2015-09-19 DIAGNOSIS — E785 Hyperlipidemia, unspecified: Secondary | ICD-10-CM

## 2015-09-19 DIAGNOSIS — Z0000 Encounter for general adult medical examination without abnormal findings: Secondary | ICD-10-CM

## 2015-09-19 DIAGNOSIS — Z0189 Encounter for other specified special examinations: Secondary | ICD-10-CM

## 2015-09-19 DIAGNOSIS — F900 Attention-deficit hyperactivity disorder, predominantly inattentive type: Secondary | ICD-10-CM | POA: Diagnosis not present

## 2015-09-19 DIAGNOSIS — I10 Essential (primary) hypertension: Secondary | ICD-10-CM

## 2015-09-19 HISTORY — DX: Hyperlipidemia, unspecified: E78.5

## 2015-09-19 MED ORDER — LISDEXAMFETAMINE DIMESYLATE 40 MG PO CAPS: 40.0000 mg | ORAL_CAPSULE | ORAL | Status: DC

## 2015-09-19 NOTE — Progress Notes (Signed)
   Subjective:    Patient ID: Leon Carroll, male    DOB: 09/18/1975, 40 y.o.   MRN: [id number]  HPI  Here to f/u ADD as PCP out of office today;  vyvanse working ok at current dose, but does have a slower time for an hour about 130pm each day for unclear reasons.  O/w able to function at work and social.  Does notice reduced appetite but no wt loss. Pt denies chest pain, increased sob or doe, wheezing, orthopnea, PND, increased LE swelling, palpitations, dizziness or syncope.  Ran out of med Wt Readings from Last 3 Encounters:  09/19/15 237 lb (107.502 kg)  06/24/15 231 lb (104.781 kg)  03/13/15 232 lb (105.235 kg)   BP Readings from Last 3 Encounters:  09/19/15 144/92  06/24/15 128/84  03/13/15 116/70  No past medical history on file. No past surgical history on file.  reports that he has quit smoking. He has never used smokeless tobacco. He reports that he does not drink alcohol or use illicit drugs. family history includes Bladder Cancer in his father; Hypertension in his father. There is no history of Stroke, Cancer, Diabetes, Early death, Heart disease, Hyperlipidemia, Kidney disease, or Arthritis. No Known Allergies No current outpatient prescriptions on file prior to visit.   No current facility-administered medications on file prior to visit.   3Review of Systems All otherwise neg per pt     Objective:   Physical Exam BP 144/92 mmHg  Pulse 94  Temp(Src) 98.5 F (36.9 C) (Oral)  Ht  (1.854 m)  Wt 237 lb (107.502 kg)  BMI 31.27 kg/m2  SpO2 97% VS noted,  Constitutional: Pt appears in no significant distress HENT: Head: NCAT.  Right Ear: External ear normal.  Left Ear: External ear normal.  Eyes: . Pupils are equal, round, and reactive to light. Conjunctivae and EOM are normal Neck: Normal range of motion. Neck supple.  Cardiovascular: Normal rate and regular rhythm.   Pulmonary/Chest: Effort normal and breath sounds without rales or wheezing.    Neurological: Pt is alert. Not confused , motor grossly intact Skin: Skin is warm. No rash, no LE edema Psychiatric: Pt behavior is normal. No agitation. 1-2+ nervous    Assessment & Plan:

## 2015-09-19 NOTE — Progress Notes (Signed)
Pre visit review using our clinic review tool, if applicable. No additional management support is needed unless otherwise documented below in the visit note. 

## 2015-09-19 NOTE — Patient Instructions (Addendum)
Please continue all other medications as before, and refills have been done if requested - the Vyvanse  Please have the pharmacy call with any other refills you may need.  Please continue your efforts at being more active, low cholesterol diet, and weight control.  Please keep your appointments with your specialists as you may have planned  Please return in 1 month to Dr Yetta Barre, or sooner if needed, with Lab testing done 3-5 days before, to follow up Blood Pressure, cholesterol and Vyvanse dose

## 2015-09-19 NOTE — Telephone Encounter (Signed)
Pt request for the No Show to be taken off for 03/10/15, pt stated he never miss any day of appt or not show up for the appt in any day and he had to work and he said that he called and let our office know. Please check and let the pt know.

## 2015-09-20 NOTE — Assessment & Plan Note (Signed)
Mild elev today, declines new med tx, for cont'd monitor at home and next visit

## 2015-09-20 NOTE — Assessment & Plan Note (Signed)
Stable, for med refill, cont to monitor for efficacy and side effect

## 2015-09-20 NOTE — Assessment & Plan Note (Signed)
Lab Results  Component Value Date   LDLCALC 129* 11/11/2010   Also asked pt to follow lower chol diet, f/u next visit with PCP

## 2015-09-23 NOTE — Telephone Encounter (Signed)
I have emailed charge correction to void this no show charge as a patient courtesy. This is his only no show and he made an effort that morning to contact us. Please notify patient.

## 2015-10-16 ENCOUNTER — Ambulatory Visit (INDEPENDENT_AMBULATORY_CARE_PROVIDER_SITE_OTHER): Payer: BLUE CROSS/BLUE SHIELD | Admitting: Internal Medicine

## 2015-10-16 ENCOUNTER — Other Ambulatory Visit (INDEPENDENT_AMBULATORY_CARE_PROVIDER_SITE_OTHER): Payer: BLUE CROSS/BLUE SHIELD

## 2015-10-16 ENCOUNTER — Encounter: Payer: Self-pay | Admitting: Internal Medicine

## 2015-10-16 VITALS — BP 118/80 | HR 85 | Temp 98.0°F | Resp 16 | Ht 73.0 in | Wt 232.0 lb

## 2015-10-16 DIAGNOSIS — E785 Hyperlipidemia, unspecified: Secondary | ICD-10-CM

## 2015-10-16 DIAGNOSIS — Z23 Encounter for immunization: Secondary | ICD-10-CM

## 2015-10-16 DIAGNOSIS — F900 Attention-deficit hyperactivity disorder, predominantly inattentive type: Secondary | ICD-10-CM

## 2015-10-16 DIAGNOSIS — Z0000 Encounter for general adult medical examination without abnormal findings: Secondary | ICD-10-CM

## 2015-10-16 LAB — COMPREHENSIVE METABOLIC PANEL
ALBUMIN: 4.7 g/dL (ref 3.5–5.2)
ALK PHOS: 49 U/L (ref 39–117)
ALT: 14 U/L (ref 0–53)
AST: 17 U/L (ref 0–37)
BILIRUBIN TOTAL: 1.5 mg/dL — AB (ref 0.2–1.2)
BUN: 12 mg/dL (ref 6–23)
CALCIUM: 9.7 mg/dL (ref 8.4–10.5)
CHLORIDE: 102 meq/L (ref 96–112)
CO2: 29 mEq/L (ref 19–32)
CREATININE: 1.17 mg/dL (ref 0.40–1.50)
GFR: 73.73 mL/min (ref >60.00)
Glucose, Bld: 92 mg/dL (ref 70–99)
Potassium: 4.2 mEq/L (ref 3.5–5.1)
SODIUM: 137 meq/L (ref 135–145)
TOTAL PROTEIN: 7.9 g/dL (ref 6.0–8.3)

## 2015-10-16 LAB — URINALYSIS, ROUTINE W REFLEX MICROSCOPIC
Bilirubin Urine: NEGATIVE
Hgb urine dipstick: NEGATIVE
KETONES UR: NEGATIVE
Leukocytes, UA: NEGATIVE
Nitrite: NEGATIVE
PH: 6.5 (ref 5.0–8.0)
RBC / HPF: NONE SEEN (ref 0–?)
SPECIFIC GRAVITY, URINE: 1.02 (ref 1.000–1.030)
Total Protein, Urine: NEGATIVE
Urine Glucose: NEGATIVE
Urobilinogen, UA: 0.2 (ref 0.0–1.0)
WBC UA: NONE SEEN (ref 0–?)

## 2015-10-16 LAB — CBC WITH DIFFERENTIAL/PLATELET
Basophils Absolute: 0 10*3/uL (ref 0.0–0.1)
Basophils Relative: 0.4 % (ref 0.0–3.0)
EOS PCT: 6.8 % — AB (ref 0.0–5.0)
Eosinophils Absolute: 0.3 10*3/uL (ref 0.0–0.7)
HCT: 48 % (ref 39.0–52.0)
HEMOGLOBIN: 16.6 g/dL (ref 13.0–17.0)
Lymphocytes Relative: 43.1 % (ref 12.0–46.0)
Lymphs Abs: 2.1 10*3/uL (ref 0.7–4.0)
MCHC: 34.6 g/dL (ref 30.0–36.0)
MCV: 90.3 fl (ref 78.0–100.0)
MONO ABS: 0.3 10*3/uL (ref 0.1–1.0)
MONOS PCT: 6.4 % (ref 3.0–12.0)
Neutro Abs: 2.1 10*3/uL (ref 1.4–7.7)
Neutrophils Relative %: 43.3 % (ref 43.0–77.0)
Platelets: 247 10*3/uL (ref 150.0–400.0)
RBC: 5.31 Mil/uL (ref 4.22–5.81)
RDW: 13.2 % (ref 11.5–15.5)
WBC: 5 10*3/uL (ref 4.0–10.5)

## 2015-10-16 LAB — TSH: TSH: 1.27 u[IU]/mL (ref 0.35–4.50)

## 2015-10-16 LAB — LIPID PANEL
CHOLESTEROL: 183 mg/dL (ref 0–200)
HDL: 36.6 mg/dL — ABNORMAL LOW (ref >39.00)
LDL CALC: 130 mg/dL — AB (ref 0–99)
NonHDL: 146.53
Total CHOL/HDL Ratio: 5
Triglycerides: 81 mg/dL (ref 0.0–149.0)
VLDL: 16.2 mg/dL (ref 0.0–40.0)

## 2015-10-16 LAB — HIGH SENSITIVITY CRP: CRP, High Sensitivity: 1.5 mg/L (ref 0.000–5.000)

## 2015-10-16 MED ORDER — LISDEXAMFETAMINE DIMESYLATE 50 MG PO CAPS: 50.0000 mg | ORAL_CAPSULE | Freq: Every day | ORAL | Status: DC

## 2015-10-16 NOTE — Progress Notes (Signed)
Pre visit review using our clinic review tool, if applicable. No additional management support is needed unless otherwise documented below in the visit note. 

## 2015-10-16 NOTE — Patient Instructions (Signed)

## 2015-10-17 ENCOUNTER — Encounter: Payer: Self-pay | Admitting: Internal Medicine

## 2015-10-17 LAB — HIV ANTIBODY (ROUTINE TESTING W REFLEX): HIV 1&2 Ab, 4th Generation: NONREACTIVE

## 2015-10-18 NOTE — Progress Notes (Signed)
Subjective:  Patient ID: Leon Carroll, male    DOB: [birth year]  Age: 40 y.o. 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 He also wants to increase his dose of Vyvanse as he complains of a lull in his mental alertness and focus at about 1 pm.  Outpatient Prescriptions Prior to Visit  Medication Sig Dispense Refill  . lisdexamfetamine (VYVANSE) 40 MG capsule Take 1 capsule (40 mg total) by mouth every morning. 30 capsule 0   No facility-administered medications prior to visit.    ROS Review of Systems  Constitutional: Positive for unexpected weight change. Negative for fever, chills, diaphoresis, appetite change and fatigue.  HENT: Negative.   Eyes: Negative.  Negative for photophobia and visual disturbance.  Respiratory: Positive for shortness of breath. Negative for cough, choking, chest tightness, wheezing and stridor.   Cardiovascular: Negative.  Negative for chest pain, palpitations and leg swelling.  Gastrointestinal: Negative.  Negative for nausea, vomiting, abdominal pain, diarrhea and constipation.  Endocrine: Negative.   Genitourinary: Negative.  Negative for dysuria, urgency, hematuria and difficulty urinating.  Musculoskeletal: Negative.  Negative for myalgias, back pain, arthralgias and neck pain.  Skin: Negative.  Negative for color change and rash.  Allergic/Immunologic: Negative.   Neurological: Negative.  Negative for dizziness, tremors, weakness, light-headedness, numbness and headaches.  Hematological: Negative.  Negative for adenopathy. Does not bruise/bleed easily.  Psychiatric/Behavioral: Positive for decreased concentration. Negative for behavioral problems, confusion, sleep disturbance, self-injury and dysphoric mood. The patient is not  nervous/anxious.     Objective:  BP 118/80 mmHg  Pulse 85  Temp(Src) 98 F (36.7 C) (Oral)  Resp 16  Ht  (1.854 m)  Wt 232 lb (105.235 kg)  BMI 30.62 kg/m2  SpO2 98%  BP Readings from Last 3 Encounters:  10/16/15 118/80  09/19/15 144/92  06/24/15 128/84    Wt Readings from Last 3 Encounters:  10/16/15 232 lb (105.235 kg)  09/19/15 237 lb (107.502 kg)  06/24/15 231 lb (104.781 kg)    Physical Exam  Constitutional: He is oriented to person, place, and time. He appears well-developed and well-nourished. He is sleeping. No distress.  HENT:  Head: Normocephalic and atraumatic.  Mouth/Throat: Oropharynx is clear and moist. No oropharyngeal exudate.  Eyes: Conjunctivae and EOM are normal. Right eye exhibits no discharge. Left eye exhibits no discharge. No scleral icterus.  Neck: Normal range of motion. Neck supple. No JVD present. No tracheal deviation present. No thyromegaly present.  Cardiovascular: Normal rate, regular rhythm, normal heart sounds and intact distal pulses.  Exam reveals no gallop and no friction rub.   No murmur heard. Pulses:      Carotid pulses are 1+ on the right side, and 1+ on the left side.      Radial pulses are 1+ on the right side, and 1+ on the left side.       Femoral pulses are 1+ on the right side, and 1+ on the left side.      Popliteal pulses are 1+ on the right side, and 1+ on the left side.       Dorsalis pedis pulses are 1+ on the right side, and 1+ on the left side.  Posterior tibial pulses are 1+ on the right side, and 1+ on the left side.  EKG - NSR, no Q waves, no LVH, no ST/T wave changes  Pulmonary/Chest: Effort normal and breath sounds normal. No stridor. No respiratory distress. He has no wheezes. He has no rales. He exhibits no tenderness.  Abdominal: Soft. Bowel sounds are normal. He exhibits no distension and no mass. There is no tenderness. There is no rebound and no guarding. Hernia confirmed negative in the right  inguinal area and confirmed negative in the left inguinal area.  Genitourinary: Testes normal. Right testis shows no mass, no swelling and no tenderness. Right testis is descended. Left testis shows no mass, no swelling and no tenderness. Left testis is descended. No penile erythema or penile tenderness. No discharge found.  Musculoskeletal: Normal range of motion. He exhibits no edema or tenderness.  Lymphadenopathy:    He has no cervical adenopathy.       Right: No inguinal adenopathy present.       Left: No inguinal adenopathy present.  Neurological: He is oriented to person, place, and time.  Skin: Skin is warm and dry. No rash noted. He is not diaphoretic. No erythema. No pallor.  Psychiatric: He has a normal mood and affect. His behavior is normal. Judgment and thought content normal.  Vitals reviewed.   Lab Results  Component Value Date   WBC 5.0 10/16/2015   HGB 16.6 10/16/2015   HCT 48.0 10/16/2015   PLT 247.0 10/16/2015   GLUCOSE 92 10/16/2015   CHOL 183 10/16/2015   TRIG 81.0 10/16/2015   HDL 36.60* 10/16/2015   LDLDIRECT 173.0 08/02/2013   LDLCALC 130* 10/16/2015   ALT 14 10/16/2015   AST 17 10/16/2015   NA 137 10/16/2015   K 4.2 10/16/2015   CL 102 10/16/2015   CREATININE 1.17 10/16/2015   BUN 12 10/16/2015   CO2 29 10/16/2015   TSH 1.27 10/16/2015    Dg Chest 2 View  07/01/2014  CLINICAL DATA:  Cough and congestion with fever 7 days. EXAM: CHEST  2 VIEW COMPARISON:  None. FINDINGS: Lungs are adequately inflated with airspace consolidation over the right lower lobe compatible with a pneumonia. No evidence of effusion. Cardiomediastinal silhouette is within normal. Bones and soft tissues are unremarkable. IMPRESSION: Airspace consolidation over the right lower lobe compatible with a pneumonia. Electronically Signed   By: Elberta Fortis M.D.   On: 07/01/2014 15:10    Assessment & Plan:   Arley was seen today for annual exam and adhd.  Diagnoses and all orders for  this visit:  ADHD (attention deficit hyperactivity disorder), inattentive type- I will increase Vyvanse from 40 mg QD to 50 mg QD -     Discontinue: lisdexamfetamine (VYVANSE) 50 MG capsule; Take 1 capsule (50 mg total) by mouth daily. -     Discontinue: lisdexamfetamine (VYVANSE) 50 MG capsule; Take 1 capsule (50 mg total) by mouth daily. -     Discontinue: lisdexamfetamine (VYVANSE) 50 MG capsule; Take 1 capsule (50 mg total) by mouth daily. -     lisdexamfetamine (VYVANSE) 50 MG capsule; Take 1 capsule (50 mg total) by mouth daily.  Encounter for immunization -     Flu Vaccine QUAD 36+ mos IM  Routine general medical examination at a health care facility- vaccines were reviewed and updated, EKG is normal and hs-CRP is normal so the DOE is not consistent with CAD or CHF but more related to weight gain. Exam completed, labs ordered  and reviewed, pt ed material was given. -     TSH; Future -     HIV antibody; Future -     Comprehensive metabolic panel; Future -     CBC with Differential/Platelet; Future -     Lipid panel; Future -     Urinalysis, Routine w reflex microscopic (not at [facility]); Future -     High sensitivity CRP; Future -     EKG 12-Lead   I am having Mr. Pires maintain his lisdexamfetamine.  Meds ordered this encounter  Medications  . DISCONTD: lisdexamfetamine (VYVANSE) 50 MG capsule    Sig: Take 1 capsule (50 mg total) by mouth daily.    Dispense:  30 capsule    Refill:  0  . DISCONTD: lisdexamfetamine (VYVANSE) 50 MG capsule    Sig: Take 1 capsule (50 mg total) by mouth daily.    Dispense:  30 capsule    Refill:  0    Fill on or after 11/13/15  . DISCONTD: lisdexamfetamine (VYVANSE) 50 MG capsule    Sig: Take 1 capsule (50 mg total) by mouth daily.    Dispense:  30 capsule    Refill:  0    Fill on or after 12/14/15  . lisdexamfetamine (VYVANSE) 50 MG capsule    Sig: Take 1 capsule (50 mg total) by mouth daily.    Dispense:  30 capsule    Refill:  0    Fill  on or after 01/13/16     Follow-up: Return in about 4 months (around 02/13/2016).  Sanda Linger, MD

## 2015-10-18 NOTE — Assessment & Plan Note (Signed)
He has achieved his LDL goal and has a low Framingham risk score

## 2016-01-28 ENCOUNTER — Encounter: Payer: Self-pay | Admitting: Internal Medicine

## 2016-01-28 ENCOUNTER — Other Ambulatory Visit (INDEPENDENT_AMBULATORY_CARE_PROVIDER_SITE_OTHER): Payer: BLUE CROSS/BLUE SHIELD

## 2016-01-28 ENCOUNTER — Ambulatory Visit (INDEPENDENT_AMBULATORY_CARE_PROVIDER_SITE_OTHER): Payer: BLUE CROSS/BLUE SHIELD | Admitting: Internal Medicine

## 2016-01-28 VITALS — BP 120/84 | HR 88 | Temp 97.8°F | Resp 16 | Ht 73.0 in | Wt 225.0 lb

## 2016-01-28 DIAGNOSIS — G4700 Insomnia, unspecified: Secondary | ICD-10-CM | POA: Diagnosis not present

## 2016-01-28 DIAGNOSIS — K5909 Other constipation: Secondary | ICD-10-CM | POA: Diagnosis not present

## 2016-01-28 DIAGNOSIS — L819 Disorder of pigmentation, unspecified: Secondary | ICD-10-CM

## 2016-01-28 DIAGNOSIS — E559 Vitamin D deficiency, unspecified: Secondary | ICD-10-CM

## 2016-01-28 DIAGNOSIS — I10 Essential (primary) hypertension: Secondary | ICD-10-CM

## 2016-01-28 DIAGNOSIS — R5382 Chronic fatigue, unspecified: Secondary | ICD-10-CM

## 2016-01-28 HISTORY — DX: Vitamin D deficiency, unspecified: E55.9

## 2016-01-28 LAB — COMPREHENSIVE METABOLIC PANEL
ALBUMIN: 4.5 g/dL (ref 3.5–5.2)
ALK PHOS: 47 U/L (ref 39–117)
ALT: 11 U/L (ref 0–53)
AST: 14 U/L (ref 0–37)
BILIRUBIN TOTAL: 1.2 mg/dL (ref 0.2–1.2)
BUN: 10 mg/dL (ref 6–23)
CO2: 29 mEq/L (ref 19–32)
CREATININE: 1.1 mg/dL (ref 0.40–1.50)
Calcium: 9.3 mg/dL (ref 8.4–10.5)
Chloride: 103 mEq/L (ref 96–112)
GFR: 79.06 mL/min (ref >60.00)
Glucose, Bld: 94 mg/dL (ref 70–99)
POTASSIUM: 4 meq/L (ref 3.5–5.1)
SODIUM: 137 meq/L (ref 135–145)
TOTAL PROTEIN: 7.1 g/dL (ref 6.0–8.3)

## 2016-01-28 LAB — CBC WITH DIFFERENTIAL/PLATELET
Basophils Absolute: 0 10*3/uL (ref 0.0–0.1)
Basophils Relative: 0.4 % (ref 0.0–3.0)
EOS ABS: 0.2 10*3/uL (ref 0.0–0.7)
Eosinophils Relative: 4 % (ref 0.0–5.0)
HCT: 47.2 % (ref 39.0–52.0)
HEMOGLOBIN: 16 g/dL (ref 13.0–17.0)
Lymphocytes Relative: 41.5 % (ref 12.0–46.0)
Lymphs Abs: 2.2 10*3/uL (ref 0.7–4.0)
MCHC: 33.9 g/dL (ref 30.0–36.0)
MCV: 90.6 fl (ref 78.0–100.0)
MONO ABS: 0.4 10*3/uL (ref 0.1–1.0)
Monocytes Relative: 6.7 % (ref 3.0–12.0)
Neutro Abs: 2.6 10*3/uL (ref 1.4–7.7)
Neutrophils Relative %: 47.4 % (ref 43.0–77.0)
Platelets: 260 10*3/uL (ref 150.0–400.0)
RBC: 5.22 Mil/uL (ref 4.22–5.81)
RDW: 13 % (ref 11.5–15.5)
WBC: 5.4 10*3/uL (ref 4.0–10.5)

## 2016-01-28 LAB — THYROID PANEL WITH TSH
Free Thyroxine Index: 2.8 (ref 1.4–3.8)
T3 Uptake: 30 % (ref 22–35)
T4 TOTAL: 9.3 ug/dL (ref 4.5–12.0)
TSH: 1.62 mIU/L (ref 0.40–4.50)

## 2016-01-28 LAB — VITAMIN D 25 HYDROXY (VIT D DEFICIENCY, FRACTURES): VITD: 26.62 ng/mL — AB (ref 30.00–100.00)

## 2016-01-28 MED ORDER — CHOLECALCIFEROL 50 MCG (2000 UT) PO TABS: 1.0000 | ORAL_TABLET | Freq: Every day | ORAL | Status: DC

## 2016-01-28 NOTE — Progress Notes (Signed)
Pre visit review using our clinic review tool, if applicable. No additional management support is needed unless otherwise documented below in the visit note. 

## 2016-01-28 NOTE — Patient Instructions (Signed)
Fatigue  Fatigue is feeling tired all of the time, a lack of energy, or a lack of motivation. Occasional or mild fatigue is often a normal response to activity or life in general. However, long-lasting (chronic) or extreme fatigue may indicate an underlying medical condition.  HOME CARE INSTRUCTIONS   Watch your fatigue for any changes. The following actions may help to lessen any discomfort you are feeling:  · Talk to your health care provider about how much sleep you need each night. Try to get the required amount every night.  · Take medicines only as directed by your health care provider.  · Eat a healthy and nutritious diet. Ask your health care provider if you need help changing your diet.  · Drink enough fluid to keep your urine clear or pale yellow.  · Practice ways of relaxing, such as yoga, meditation, massage therapy, or acupuncture.  · Exercise regularly.    · Change situations that cause you stress. Try to keep your work and personal routine reasonable.  · Do not abuse illegal drugs.  · Limit alcohol intake to no more than 1 drink per day for nonpregnant women and 2 drinks per day for men. One drink equals 12 ounces of beer, 5 ounces of wine, or 1½ ounces of hard liquor.  · Take a multivitamin, if directed by your health care provider.  SEEK MEDICAL CARE IF:   · Your fatigue does not get better.  · You have a fever.    · You have unintentional weight loss or gain.  · You have headaches.    · You have difficulty:      Falling asleep.    Sleeping throughout the night.  · You feel angry, guilty, anxious, or sad.     · You are unable to have a bowel movement (constipation).    · You skin is dry.     · Your legs or another part of your body is swollen.    SEEK IMMEDIATE MEDICAL CARE IF:   · You feel confused.    · Your vision is blurry.  · You feel faint or pass out.    · You have a severe headache.    · You have severe abdominal, pelvic, or back pain.    · You have chest pain, shortness of breath, or an  irregular or fast heartbeat.    · You are unable to urinate or you urinate less than normal.    · You develop abnormal bleeding, such as bleeding from the rectum, vagina, nose, lungs, or nipples.  · You vomit blood.     · You have thoughts about harming yourself or committing suicide.    · You are worried that you might harm someone else.       This information is not intended to replace advice given to you by your health care provider. Make sure you discuss any questions you have with your health care provider.     Document Released: 06/13/2007 Document Revised: 09/06/2014 Document Reviewed: 12/18/2013  [employer] Interactive Patient Education ©2016 [employer]

## 2016-01-28 NOTE — Progress Notes (Signed)
Subjective:  Patient ID: Leon Carroll, male    DOB: 1975/11/19  Age: 40 y.o. MRN: [id number]  CC: Fatigue   HPI Leon Carroll presents for evaluation of several concerns.  He has what he thinks is a skin tag on his left lower back that he wants removed. He tells me it is getting larger and feels irritated.  He also complains of worsening fatigue over the last few months. He says he wakes up in the morning feeling fatigued. He sleeps about 6 or 7 hours per night and he says his sleep is not disturbed though he does complain that his wife is as a heavy snorer. He doesn't think he snores or has sleep apnea. He plays tennis several times a week and does not experience any dyspnea on exertion, chest pain, edema, palpitations, or fatigue.  He also complains of mild constipation and tell me that Linzess has helped.  Outpatient Prescriptions Prior to Visit  Medication Sig Dispense Refill  . lisdexamfetamine (VYVANSE) 50 MG capsule Take 1 capsule (50 mg total) by mouth daily. 30 capsule 0   No facility-administered medications prior to visit.    ROS Review of Systems  Constitutional: Positive for fatigue. Negative for fever, chills, diaphoresis, appetite change and unexpected weight change.  HENT: Negative.   Eyes: Negative.  Negative for visual disturbance.  Respiratory: Negative.  Negative for cough, choking, chest tightness, shortness of breath and stridor.   Cardiovascular: Negative.  Negative for chest pain, palpitations and leg swelling.  Gastrointestinal: Positive for constipation. Negative for nausea, vomiting, abdominal pain, diarrhea and blood in stool.  Endocrine: Negative.   Genitourinary: Negative.  Negative for urgency, frequency, enuresis and difficulty urinating.  Musculoskeletal: Negative.  Negative for myalgias, back pain, joint swelling, arthralgias and neck pain.  Skin: Negative.  Negative for color change and rash.  Allergic/Immunologic: Negative.     Neurological: Negative.  Negative for dizziness, tremors, light-headedness, numbness and headaches.  Hematological: Negative.  Negative for adenopathy. Does not bruise/bleed easily.  Psychiatric/Behavioral: Positive for sleep disturbance (rare insomnia). Negative for suicidal ideas, behavioral problems, confusion, dysphoric mood and decreased concentration. The patient is not nervous/anxious.     Objective:  BP 120/84 mmHg  Pulse 88  Temp(Src) 97.8 F (36.6 C) (Oral)  Resp 16  Ht 6\' 1"  (1.854 m)  Wt 225 lb (102.059 kg)  BMI 29.69 kg/m2  SpO2 97%  BP Readings from Last 3 Encounters:  01/28/16 120/84  10/16/15 118/80  09/19/15 144/92    Wt Readings from Last 3 Encounters:  01/28/16 225 lb (102.059 kg)  10/16/15 232 lb (105.235 kg)  09/19/15 237 lb (107.502 kg)    Physical Exam  Constitutional: He is oriented to person, place, and time. No distress.  HENT:  Head: Normocephalic and atraumatic.  Mouth/Throat: Oropharynx is clear and moist. No oropharyngeal exudate.  Eyes: Conjunctivae are normal. Right eye exhibits no discharge. Left eye exhibits no discharge. No scleral icterus.  Neck: Normal range of motion. Neck supple. No JVD present. No tracheal deviation present. No thyromegaly present.  Cardiovascular: Normal rate, regular rhythm, normal heart sounds and intact distal pulses.  Exam reveals no gallop and no friction rub.   No murmur heard. EKG -----  Sinus  Rhythm  WITHIN NORMAL LIMITS   Pulmonary/Chest: Effort normal and breath sounds normal. No stridor. No respiratory distress. He has no wheezes. He has no rales. He exhibits no tenderness.  Abdominal: Soft. Bowel sounds are normal. He exhibits no distension and  no mass. There is no tenderness. There is no rebound and no guarding.  Musculoskeletal: Normal range of motion. He exhibits no edema or tenderness.  Lymphadenopathy:    He has no cervical adenopathy.  Neurological: He is oriented to person, place, and time.   Skin: Skin is warm and dry. No rash noted. He is not diaphoretic. No erythema. No pallor.     The lesion over his left lower back was cleaned with Betadine and prepped and draped in sterile fashion. Local anesthesia was obtained with instillation of 2% lidocaine with epi at the base. Approximate 1.5 mL were used. Next a excision biopsy was performed and the lesion was removed and sent for pathology. Cautery was performed with a silver nitrate pencil. Neosporin and dressing was applied. Hemostasis was obtained and he tolerated procedure well.  Vitals reviewed.   Lab Results  Component Value Date   WBC 5.4 01/28/2016   HGB 16.0 01/28/2016   HCT 47.2 01/28/2016   PLT 260.0 01/28/2016   GLUCOSE 94 01/28/2016   CHOL 183 10/16/2015   TRIG 81.0 10/16/2015   HDL 36.60* 10/16/2015   LDLDIRECT 173.0 08/02/2013   LDLCALC 130* 10/16/2015   ALT 11 01/28/2016   AST 14 01/28/2016   NA 137 01/28/2016   K 4.0 01/28/2016   CL 103 01/28/2016   CREATININE 1.10 01/28/2016   BUN 10 01/28/2016   CO2 29 01/28/2016   TSH 1.62 01/28/2016    Dg Chest 2 View  07/01/2014  CLINICAL DATA:  Cough and congestion with fever 7 days. EXAM: CHEST  2 VIEW COMPARISON:  None. FINDINGS: Lungs are adequately inflated with airspace consolidation over the right lower lobe compatible with a pneumonia. No evidence of effusion. Cardiomediastinal silhouette is within normal. Bones and soft tissues are unremarkable. IMPRESSION: Airspace consolidation over the right lower lobe compatible with a pneumonia. Electronically Signed   By: Elberta Fortis M.D.   On: 07/01/2014 15:10    Assessment & Plan:   Leon Carroll was seen today for fatigue.  Diagnoses and all orders for this visit:  Insomnia -     Ambulatory referral to Pulmonology  Other constipation- his labs are negative for any secondary or metabolic causes for constipation, he will continue Linzess as needed for symptom relief. -     Thyroid Panel With TSH; Future -      Comprehensive metabolic panel; Future  Essential hypertension, benign- his blood pressure is adequately well controlled, labs do not reveal any secondary causes for hypertension, electrolytes and renal function are stable. His EKG is negative for LVH or any concerns for ischemia so I don't think his fatigue is related to cardiovascular disease. -     Thyroid Panel With TSH; Future -     Comprehensive metabolic panel; Future -     CBC with Differential/Platelet; Future  Chronic fatigue- I think he should be screened for sleep disorder so I've asked him to have a sleep study performed as this may explain his fatigue, he has a slightly low vitamin D level and a low/normal testosterone level, I will replace his vitamin D level but would like to recheck his testosterone level another day just to get a second level to confirm that his fatigue may or may not be related to hypogonadism. All the other labs are okay and do not indicate any secondary metabolic causes for fatigue. -     Thyroid Panel With TSH; Future -     Comprehensive metabolic panel; Future -  CBC with Differential/Platelet; Future -     Ambulatory referral to Pulmonology -     EKG 12-Lead -     VITAMIN D 25 Hydroxy (Vit-D Deficiency, Fractures); Future -     Testosterone, Free, Total, SHBG; Future  Hypopigmented skin lesion- biopsy confirms that this is a benign lesion, its a melanocytic nevus. It is been thoroughly excised. -     Cancel: Dermatology pathology; Future -     Dermatology pathology  Vitamin D deficiency- this may explain some of his symptoms, we'll start daily vitamin D supplement -     Cholecalciferol 2000 units TABS; Take 1 tablet (2,000 Units total) by mouth daily.   I am having Leon Carroll start on Cholecalciferol. I am also having him maintain his lisdexamfetamine.  Meds ordered this encounter  Medications  . Cholecalciferol 2000 units TABS    Sig: Take 1 tablet (2,000 Units total) by mouth daily.     Dispense:  90 tablet    Refill:  3     Follow-up: Return in about 2 months (around 03/29/2016).  Sanda Lingerhomas Shania Bjelland, MD

## 2016-01-29 ENCOUNTER — Encounter: Payer: Self-pay | Admitting: Internal Medicine

## 2016-01-29 ENCOUNTER — Other Ambulatory Visit (INDEPENDENT_AMBULATORY_CARE_PROVIDER_SITE_OTHER): Payer: BLUE CROSS/BLUE SHIELD

## 2016-01-29 ENCOUNTER — Telehealth: Payer: Self-pay

## 2016-01-29 DIAGNOSIS — G9332 Myalgic encephalomyelitis/chronic fatigue syndrome: Secondary | ICD-10-CM

## 2016-01-29 DIAGNOSIS — R5382 Chronic fatigue, unspecified: Secondary | ICD-10-CM | POA: Diagnosis not present

## 2016-01-29 LAB — TESTOSTERONE: Testosterone: 312.28 ng/dL (ref 300.00–890.00)

## 2016-01-29 NOTE — Telephone Encounter (Signed)
Fax add on sheet per PCP request for testosterone panel

## 2016-02-16 ENCOUNTER — Telehealth: Payer: Self-pay

## 2016-02-16 ENCOUNTER — Encounter: Payer: Self-pay | Admitting: Family

## 2016-02-16 ENCOUNTER — Ambulatory Visit (INDEPENDENT_AMBULATORY_CARE_PROVIDER_SITE_OTHER): Payer: BLUE CROSS/BLUE SHIELD | Admitting: Family

## 2016-02-16 ENCOUNTER — Other Ambulatory Visit: Payer: BLUE CROSS/BLUE SHIELD

## 2016-02-16 ENCOUNTER — Other Ambulatory Visit: Payer: Self-pay | Admitting: Family

## 2016-02-16 VITALS — BP 130/88 | HR 86 | Temp 98.1°F | Resp 16 | Ht 73.0 in | Wt 225.0 lb

## 2016-02-16 DIAGNOSIS — R300 Dysuria: Secondary | ICD-10-CM | POA: Diagnosis not present

## 2016-02-16 DIAGNOSIS — F900 Attention-deficit hyperactivity disorder, predominantly inattentive type: Secondary | ICD-10-CM

## 2016-02-16 LAB — POCT URINALYSIS DIPSTICK
BILIRUBIN UA: NEGATIVE
GLUCOSE UA: NEGATIVE
Ketones, UA: NEGATIVE
Leukocytes, UA: NEGATIVE
Nitrite, UA: NEGATIVE
Protein, UA: NEGATIVE
RBC UA: NEGATIVE
SPEC GRAV UA: 1.015
Urobilinogen, UA: NEGATIVE
pH, UA: 6

## 2016-02-16 MED ORDER — LISDEXAMFETAMINE DIMESYLATE 50 MG PO CAPS: 50.0000 mg | ORAL_CAPSULE | Freq: Every day | ORAL | Status: DC

## 2016-02-16 MED ORDER — LEVOFLOXACIN 500 MG PO TABS: 500.0000 mg | ORAL_TABLET | Freq: Every day | ORAL | Status: DC

## 2016-02-16 NOTE — Telephone Encounter (Signed)
done

## 2016-02-16 NOTE — Telephone Encounter (Signed)
lisdexamfetamine (VYVANSE) 50 MG capsule [[id number]][[id number]] Patient is requesting a refill on the following medication.

## 2016-02-16 NOTE — Progress Notes (Signed)
   Subjective:    Patient ID: Leon Carroll, male    DOB: 08-04-1976, 40 y.o.   MRN: [id number]  Chief Complaint  Patient presents with  . Dysuria    states that it is a little painful at the beginning of trying to urinate, noticed the pain saturday evening    HPI:  Leon Carroll is a 40 y.o. male who  has no past medical history on file. and presents today for an acute office visit.  This is a new problem. Associated symptom of dysuria has been going on for about 3 days. Symptoms start generally after he first begins to urinate and improves as he completes urinating. No fevers. Denies any frequency, urgency or discharge or trauma. Modifying factors include AZO which did not help very much. Denies any antibiotics recently. Has not had previous history. No rashes.   Allergies  Allergen Reactions  . Azithromycin Nausea And Vomiting     Current Outpatient Prescriptions on File Prior to Visit  Medication Sig Dispense Refill  . Cholecalciferol 2000 units TABS Take 1 tablet (2,000 Units total) by mouth daily. 90 tablet 3   No current facility-administered medications on file prior to visit.    No past medical history on file.   Review of Systems  Constitutional: Negative for fever and chills.  Genitourinary: Positive for dysuria. Negative for urgency, frequency, hematuria and flank pain.      Objective:    BP 130/88 mmHg  Pulse 86  Temp(Src) 98.1 F (36.7 C) (Oral)  Resp 16  Ht 6\' 1"  (1.854 m)  Wt 225 lb (102.059 kg)  BMI 29.69 kg/m2  SpO2 97% Nursing note and vital signs reviewed.  Physical Exam  Constitutional: He is oriented to person, place, and time. He appears well-developed and well-nourished. No distress.  Cardiovascular: Normal rate, regular rhythm, normal heart sounds and intact distal pulses.   Pulmonary/Chest: Effort normal and breath sounds normal.  Neurological: He is alert and oriented to person, place, and time.  Skin: Skin is warm and dry.    Psychiatric: He has a normal mood and affect. His behavior is normal. Judgment and thought content normal.       Assessment & Plan:   Problem List Items Addressed This Visit      Other   Dysuria - Primary    Declines physical exam. In office urinalysis negative for leukocytes, nitrites, and hematuria. Urine sent for culture with additional gonorrhea/chlamydia probe. Cannot rule out small cut although unlikely. No symptoms of prostatitis apparent and also unlikely. Treat empirically with Levofloxacin pending urine culture results. If symptoms do not improve consider referral to urology.       Relevant Medications   levofloxacin (LEVAQUIN) 500 MG tablet   Other Relevant Orders   POCT urinalysis dipstick (Completed)   Urine culture   GC/chlamydia probe amp, urine       I am having Mr. Parmenter start on levofloxacin. I am also having him maintain his Cholecalciferol and lisdexamfetamine.   Meds ordered this encounter  Medications  . levofloxacin (LEVAQUIN) 500 MG tablet    Sig: Take 1 tablet (500 mg total) by mouth daily.    Dispense:  3 tablet    Refill:  0    Order Specific Question:  Supervising Provider    Answer:  Hillard DankerRAWFORD, ELIZABETH A [[id number]]     Follow-up: Return if symptoms worsen or fail to improve.  Jeanine Carroll, Gregory, FNP

## 2016-02-16 NOTE — Telephone Encounter (Signed)
Pls advise on msg below.../lmb 

## 2016-02-16 NOTE — Patient Instructions (Signed)
Thank you for choosing [facility].  Summary/Instructions:  Please take the antibiotic as prescribed.  We will be in touch with the urine culture.  Your prescription(s) have been submitted to your pharmacy or been printed and provided for you. Please take as directed and contact our office if you believe you are having problem(s) with the medication(s) or have any questions.  If your symptoms worsen or fail to improve, please contact our office for further instruction, or in case of emergency go directly to the emergency room at the closest medical facility.

## 2016-02-16 NOTE — Telephone Encounter (Signed)
Notified pt rx ready for pick-up.../lmb 

## 2016-02-16 NOTE — Assessment & Plan Note (Addendum)
Declines physical exam. In office urinalysis negative for leukocytes, nitrites, and hematuria. Urine sent for culture with additional gonorrhea/chlamydia probe. Cannot rule out small cut although unlikely. No symptoms of prostatitis apparent and also unlikely. Treat empirically with Levofloxacin pending urine culture results. If symptoms do not improve consider referral to urology.

## 2016-02-17 ENCOUNTER — Encounter: Payer: Self-pay | Admitting: Family

## 2016-02-17 LAB — GC/CHLAMYDIA PROBE AMP
CT Probe RNA: NOT DETECTED
GC Probe RNA: NOT DETECTED

## 2016-02-18 ENCOUNTER — Encounter: Payer: Self-pay | Admitting: Family

## 2016-02-18 LAB — URINE CULTURE
COLONY COUNT: NO GROWTH
Organism ID, Bacteria: NO GROWTH

## 2016-02-20 ENCOUNTER — Encounter: Payer: Self-pay | Admitting: Family

## 2016-02-27 ENCOUNTER — Encounter: Payer: Self-pay | Admitting: Internal Medicine

## 2016-04-13 ENCOUNTER — Encounter: Payer: Self-pay | Admitting: Internal Medicine

## 2016-04-13 ENCOUNTER — Other Ambulatory Visit: Payer: Self-pay | Admitting: Internal Medicine

## 2016-04-13 DIAGNOSIS — Z302 Encounter for sterilization: Secondary | ICD-10-CM

## 2016-04-21 ENCOUNTER — Institutional Professional Consult (permissible substitution): Payer: BLUE CROSS/BLUE SHIELD | Admitting: Pulmonary Disease

## 2016-06-16 ENCOUNTER — Telehealth: Payer: Self-pay | Admitting: *Deleted

## 2016-06-16 ENCOUNTER — Other Ambulatory Visit: Payer: Self-pay | Admitting: Internal Medicine

## 2016-06-16 DIAGNOSIS — F900 Attention-deficit hyperactivity disorder, predominantly inattentive type: Secondary | ICD-10-CM

## 2016-06-16 MED ORDER — LISDEXAMFETAMINE DIMESYLATE 50 MG PO CAPS: 50.0000 mg | ORAL_CAPSULE | Freq: Every day | ORAL | 0 refills | Status: DC

## 2016-06-16 NOTE — Telephone Encounter (Signed)
RX written 

## 2016-06-16 NOTE — Telephone Encounter (Signed)
Rec'd call pt requesting refill on his Vyvanse...Raechel Chute/lmb

## 2016-06-16 NOTE — Telephone Encounter (Signed)
Notified pt rx ready for pick-up.../lmb 

## 2016-06-24 ENCOUNTER — Institutional Professional Consult (permissible substitution): Payer: BLUE CROSS/BLUE SHIELD | Admitting: Pulmonary Disease

## 2016-09-03 ENCOUNTER — Other Ambulatory Visit (INDEPENDENT_AMBULATORY_CARE_PROVIDER_SITE_OTHER): Payer: BLUE CROSS/BLUE SHIELD

## 2016-09-03 ENCOUNTER — Ambulatory Visit (INDEPENDENT_AMBULATORY_CARE_PROVIDER_SITE_OTHER): Payer: BLUE CROSS/BLUE SHIELD | Admitting: Internal Medicine

## 2016-09-03 ENCOUNTER — Encounter: Payer: Self-pay | Admitting: Internal Medicine

## 2016-09-03 ENCOUNTER — Ambulatory Visit (INDEPENDENT_AMBULATORY_CARE_PROVIDER_SITE_OTHER)
Admission: RE | Admit: 2016-09-03 | Discharge: 2016-09-03 | Disposition: A | Discharge status: Home / Self Care | Payer: BLUE CROSS/BLUE SHIELD | Source: Ambulatory Visit | Attending: Internal Medicine | Admitting: Internal Medicine

## 2016-09-03 VITALS — BP 140/80 | HR 90 | Resp 20 | Wt 242.0 lb

## 2016-09-03 DIAGNOSIS — R05 Cough: Secondary | ICD-10-CM

## 2016-09-03 DIAGNOSIS — R059 Cough, unspecified: Secondary | ICD-10-CM

## 2016-09-03 DIAGNOSIS — R002 Palpitations: Secondary | ICD-10-CM | POA: Insufficient documentation

## 2016-09-03 DIAGNOSIS — R0600 Dyspnea, unspecified: Secondary | ICD-10-CM

## 2016-09-03 LAB — CBC WITH DIFFERENTIAL/PLATELET
BASOS PCT: 0.4 % (ref 0.0–3.0)
Basophils Absolute: 0 10*3/uL (ref 0.0–0.1)
EOS PCT: 4 % (ref 0.0–5.0)
Eosinophils Absolute: 0.3 10*3/uL (ref 0.0–0.7)
HEMATOCRIT: 44.5 % (ref 39.0–52.0)
Hemoglobin: 15.3 g/dL (ref 13.0–17.0)
LYMPHS PCT: 34.8 % (ref 12.0–46.0)
Lymphs Abs: 2.5 10*3/uL (ref 0.7–4.0)
MCHC: 34.3 g/dL (ref 30.0–36.0)
MCV: 91 fl (ref 78.0–100.0)
MONOS PCT: 6.9 % (ref 3.0–12.0)
Monocytes Absolute: 0.5 10*3/uL (ref 0.1–1.0)
NEUTROS ABS: 3.9 10*3/uL (ref 1.4–7.7)
Neutrophils Relative %: 53.9 % (ref 43.0–77.0)
PLATELETS: 233 10*3/uL (ref 150.0–400.0)
RBC: 4.89 Mil/uL (ref 4.22–5.81)
RDW: 12.8 % (ref 11.5–15.5)
WBC: 7.3 10*3/uL (ref 4.0–10.5)

## 2016-09-03 LAB — HEPATIC FUNCTION PANEL
ALT: 30 U/L (ref 0–53)
AST: 25 U/L (ref 0–37)
Albumin: 4.3 g/dL (ref 3.5–5.2)
Alkaline Phosphatase: 43 U/L (ref 39–117)
BILIRUBIN DIRECT: 0.2 mg/dL (ref 0.0–0.3)
BILIRUBIN TOTAL: 1.4 mg/dL — AB (ref 0.2–1.2)
Total Protein: 7.1 g/dL (ref 6.0–8.3)

## 2016-09-03 LAB — BASIC METABOLIC PANEL
BUN: 9 mg/dL (ref 6–23)
CALCIUM: 9.3 mg/dL (ref 8.4–10.5)
CHLORIDE: 104 meq/L (ref 96–112)
CO2: 30 mEq/L (ref 19–32)
CREATININE: 1.14 mg/dL (ref 0.40–1.50)
GFR: 75.63 mL/min (ref >60.00)
Glucose, Bld: 69 mg/dL — ABNORMAL LOW (ref 70–99)
Potassium: 3.9 mEq/L (ref 3.5–5.1)
Sodium: 139 mEq/L (ref 135–145)

## 2016-09-03 LAB — TSH: TSH: 1.86 u[IU]/mL (ref 0.35–4.50)

## 2016-09-03 MED ORDER — PREDNISONE 10 MG PO TABS: ORAL_TABLET | ORAL | 0 refills | Status: DC

## 2016-09-03 MED ORDER — AZITHROMYCIN 250 MG PO TABS: ORAL_TABLET | ORAL | 1 refills | Status: DC

## 2016-09-03 MED ORDER — ALBUTEROL SULFATE HFA 108 (90 BASE) MCG/ACT IN AERS: 2.0000 | INHALATION_SPRAY | Freq: Four times a day (QID) | RESPIRATORY_TRACT | 0 refills | Status: DC | PRN

## 2016-09-03 MED ORDER — LEVOFLOXACIN 250 MG PO TABS: 250.0000 mg | ORAL_TABLET | Freq: Every day | ORAL | 0 refills | Status: AC

## 2016-09-03 NOTE — Progress Notes (Signed)
Pre visit review using our clinic review tool, if applicable. No additional management support is needed unless otherwise documented below in the visit note. 

## 2016-09-03 NOTE — Patient Instructions (Signed)
Your EKG was OK today  Please take all new medication as prescribed - the antibiotic, prednisone, and the inhaler as needed  Please continue all other medications as before, and refills have been done if requested.  Please have the pharmacy call with any other refills you may need.  Please keep your appointments with your specialists as you may have planned  Please go to the XRAY Department in the Basement (go straight as you get off the elevator) for the x-ray testing  Please go to the LAB in the Basement (turn left off the elevator) for the tests to be done today  You will be contacted by phone if any changes need to be made immediately.  Otherwise, you will receive a letter about your results with an explanation, but please check with MyChart first.  Please remember to sign up for MyChart if you have not done so, as this will be important to you in the future with finding out test results, communicating by private email, and scheduling acute appointments online when needed.  Please call if your palpitations persist or worsen, for a Cardiac Event Monitor

## 2016-09-03 NOTE — Progress Notes (Signed)
   Subjective:    Patient ID: Leon Carroll, male    DOB: 22-Mar-1976, 41 y.o.   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 Starts and stops suddenly, each lasting only a few seconds. Pt denies chest pain, but has mild increased sob, wheezing, orthopnea, PND, increased LE swelling, palpitations, dizziness or syncope. Has not been taking the vyvanse in over 1 mo, and has mild URI? but not taking decongestant, did have mild prod cough since then, and feeling warm.  No other new history Past Medical History:  Diagnosis Date  . ADHD (attention deficit hyperactivity disorder), inattentive type 08/02/2013  . Essential hypertension, benign 08/02/2013  . Hyperlipidemia with target LDL less than 130 09/19/2015  . Vitamin D deficiency 01/28/2016   No past surgical history on file.  reports that he has quit smoking. He has never used smokeless tobacco. He reports that he does not drink alcohol or use drugs. family history includes Bladder Cancer in his father; Hypertension in his father. Allergies  Allergen Reactions  . Azithromycin Nausea And Vomiting   Current Outpatient Prescriptions on File Prior to Visit  Medication Sig Dispense Refill  . Cholecalciferol 2000 units TABS Take 1 tablet (2,000 Units total) by mouth daily. 90 tablet 3  . lisdexamfetamine (VYVANSE) 50 MG capsule Take 1 capsule (50 mg total) by mouth daily. 30 capsule 0   No current facility-administered medications on file prior to visit.    Review of Systems  Constitutional: Negative for unusual diaphoresis or night sweats HENT: Negative for ear swelling or discharge Eyes: Negative for worsening visual haziness  Respiratory: Negative for choking and stridor.     Gastrointestinal: Negative for distension or worsening eructation Genitourinary: Negative for retention or change in urine volume.  Musculoskeletal: Negative for other MSK pain or swelling Skin: Negative for color change and worsening wound Neurological: Negative for tremors and numbness other than noted  Psychiatric/Behavioral: Negative for decreased concentration or agitation other than above   All other system neg per pt    Objective:   Physical Exam BP 140/80   Pulse 90   Resp 20   Wt 242 lb (109.8 kg)   SpO2 96%   BMI 31.93 kg/m  VS noted, mild ill, nervous Constitutional: Pt appears in no apparent distress HENT: Head: NCAT.  Right Ear: External ear normal.  Left Ear: External ear normal.  Eyes: . Pupils are equal, round, and reactive to light. Conjunctivae and EOM are normal Bilat tm's with mild erythema.  Max sinus areas mild tender.  Pharynx with mild erythema, no exudate Neck: Normal range of motion. Neck supple. with few bilat tender submandib LA Cardiovascular: Normal rate and regular rhythm.   Pulmonary/Chest: Effort normal and breath sounds some decreased without rales or wheezing.  Abd:  Soft, NT, ND, + BS Neurological: Pt is alert. Not confused , motor grossly intact Skin: Skin is warm. No rash, no LE edema Psychiatric: Pt behavior is normal. No agitation.  No other new exam findings  ECG Ihave personally interpreted today Sinus  Rhythm  WITHIN NORMAL LIMITS    Assessment & Plan:

## 2016-09-04 ENCOUNTER — Encounter: Payer: Self-pay | Admitting: Internal Medicine

## 2016-09-04 NOTE — Assessment & Plan Note (Signed)
I suspect benign palpitations related to current illness, ecg reviewed with pt, pt declines cardiac event monitor unless persists

## 2016-09-04 NOTE — Assessment & Plan Note (Signed)
Mild to mod, c/w bronchitis vs pna, declines cxr, for antibx course, to f/u any worsening symptoms or concerns  

## 2016-09-04 NOTE — Assessment & Plan Note (Signed)
Likely early mild bronchospasm, for short course low dose prednisone course, inhaler prn,  to f/u any worsening symptoms or concerns

## 2016-10-14 ENCOUNTER — Encounter: Payer: Self-pay | Admitting: Internal Medicine

## 2016-10-14 ENCOUNTER — Other Ambulatory Visit: Payer: BLUE CROSS/BLUE SHIELD

## 2016-10-14 ENCOUNTER — Ambulatory Visit (INDEPENDENT_AMBULATORY_CARE_PROVIDER_SITE_OTHER): Payer: BLUE CROSS/BLUE SHIELD | Admitting: Internal Medicine

## 2016-10-14 VITALS — BP 128/82 | HR 87 | Temp 98.4°F | Resp 16 | Ht 73.0 in | Wt 245.1 lb

## 2016-10-14 DIAGNOSIS — Z23 Encounter for immunization: Secondary | ICD-10-CM

## 2016-10-14 DIAGNOSIS — R21 Rash and other nonspecific skin eruption: Secondary | ICD-10-CM | POA: Diagnosis not present

## 2016-10-14 DIAGNOSIS — F900 Attention-deficit hyperactivity disorder, predominantly inattentive type: Secondary | ICD-10-CM

## 2016-10-14 MED ORDER — ATOMOXETINE HCL 10 MG PO CAPS: 10.0000 mg | ORAL_CAPSULE | Freq: Every day | ORAL | 0 refills | Status: DC

## 2016-10-14 NOTE — Progress Notes (Signed)
Pre visit review using our clinic review tool, if applicable. No additional management support is needed unless otherwise documented below in the visit note. 

## 2016-10-14 NOTE — Progress Notes (Signed)
Subjective:  Patient ID: Leon Carroll, male    DOB: [birth year]  Age: 41 y.o. MRN: [id number]  CC: Rash and ADHD   HPI [name] presents for concerns about a rash over his right calf for about 6 weeks. He sees some discoloration there but otherwise it does not bother him.  He also requests an alternative treatment for ADHD. He is currently using Vyvanse but tells me that it causes jaw clenching and he wants to change to something else.  Outpatient Medications Prior to Visit  Medication Sig Dispense Refill  . Cholecalciferol 2000 units TABS Take 1 tablet (2,000 Units total) by mouth daily. 90 tablet 3  . albuterol (PROVENTIL HFA;VENTOLIN HFA) 108 (90 Base) MCG/ACT inhaler Inhale 2 puffs into the lungs every 6 (six) hours as needed for wheezing or shortness of breath. (Patient not taking: Reported on 10/14/2016) 1 Inhaler 0  . lisdexamfetamine (VYVANSE) 50 MG capsule Take 1 capsule (50 mg total) by mouth daily. (Patient not taking: Reported on 10/14/2016) 30 capsule 0  . predniSONE (DELTASONE) 10 MG tablet 3 tabs by mouth per day for 3 days,2tabs per day for 3 days,1tab per day for 3 days (Patient not taking: Reported on 10/14/2016) 18 tablet 0   No facility-administered medications prior to visit.     ROS Review of Systems  Constitutional: Negative.  Negative for chills, fatigue and fever.  HENT: Negative.   Eyes: Negative.   Respiratory: Negative.  Negative for chest tightness.   Cardiovascular: Negative for chest pain, palpitations and leg swelling.  Gastrointestinal: Negative for abdominal pain.  Endocrine: Negative.   Genitourinary: Negative.  Negative for decreased urine volume and dysuria.  Musculoskeletal: Positive for arthralgias. Negative for myalgias.  Skin: Positive for color change and rash. Negative for pallor.  Allergic/Immunologic: Negative.   Neurological: Negative.  Negative for dizziness, weakness, numbness and headaches.  Hematological: Negative  for adenopathy. Does not bruise/bleed easily.  Psychiatric/Behavioral: Positive for decreased concentration. Negative for agitation and sleep disturbance. The patient is not nervous/anxious and is not hyperactive.     Objective:  BP 128/82 (BP Location: Left Arm, Patient Position: Sitting, Cuff Size: Normal)   Pulse 87   Temp 98.4 F (36.9 C) (Oral)   Resp 16   Ht 6\' 1"  (1.854 m)   Wt 245 lb 1.3 oz (111.2 kg)   SpO2 98%   BMI 32.33 kg/m   BP Readings from Last 3 Encounters:  10/14/16 128/82  09/03/16 140/80  02/16/16 130/88    Wt Readings from Last 3 Encounters:  10/14/16 245 lb 1.3 oz (111.2 kg)  09/03/16 242 lb (109.8 kg)  02/16/16 225 lb (102.1 kg)    Physical Exam  Skin: Rash noted. Rash is papular.     BX note- the area was cleaned with Betadine. Local anesthesia was obtained with the instillation of 2 mL of lidocaine with epi. A 4 mm punch incision was made over the edge of the rash. He tolerated this well. The incision was closed with one interrupted suture using 3-0 nylon on a PC 3. There was good closure affect. Neosporin and a dressing were applied.  Psychiatric: He has a normal mood and affect. His behavior is normal. Judgment and thought content normal.    Lab Results  Component Value Date   WBC 7.3 09/03/2016   HGB 15.3 09/03/2016   HCT 44.5 09/03/2016   PLT 233.0 09/03/2016   GLUCOSE 69 (L) 09/03/2016   CHOL 183 10/16/2015   TRIG  81.0 10/16/2015   HDL 36.60 (L) 10/16/2015   LDLDIRECT 173.0 08/02/2013   LDLCALC 130 (H) 10/16/2015   ALT 30 09/03/2016   AST 25 09/03/2016   NA 139 09/03/2016   K 3.9 09/03/2016   CL 104 09/03/2016   CREATININE 1.14 09/03/2016   BUN 9 09/03/2016   CO2 30 09/03/2016   TSH 1.86 09/03/2016    Dg Chest 2 View  Result Date: 09/03/2016 CLINICAL DATA:  Shortness of breath and chest tightness for 4 days. EXAM: CHEST  2 VIEW COMPARISON:  PA and lateral chest 07/01/2014. FINDINGS: The lungs are clear. Heart size is normal. No  pneumothorax or pleural fluid. IMPRESSION: Negative chest. Electronically Signed   By: Drusilla Kanner M.D.   On: 09/03/2016 16:22    Assessment & Plan:   Vontae was seen today for rash and adhd.  Diagnoses and all orders for this visit:  Rash of body- this could be tinea, fixed drug eruption, eczema, etc. Biopsy performed to better identify the cause for this rash. -     Cancel: Dermatology pathology; Future -     Dermatology pathology  ADHD (attention deficit hyperactivity disorder), inattentive type- will discontinue Vyvanse and start Strattera. Will start Strattera at a low-dose and over the next few months gradually increase the dose. -     atomoxetine (STRATTERA) 10 MG capsule; Take 1 capsule (10 mg total) by mouth daily.  Need for immunization against influenza -     Flu Vaccine QUAD 36+ mos IM   I have discontinued Mr. Corkum lisdexamfetamine and predniSONE. I am also having him start on atomoxetine. Additionally, I am having him maintain his Cholecalciferol and albuterol.  Meds ordered this encounter  Medications  . atomoxetine (STRATTERA) 10 MG capsule    Sig: Take 1 capsule (10 mg total) by mouth daily.    Dispense:  21 capsule    Refill:  0     Follow-up: Return in about 1 week (around 10/21/2016).  Sanda Linger, MD

## 2016-10-14 NOTE — Patient Instructions (Signed)
Skin Biopsy A skin biopsy is a procedure to remove a sample of your skin (lesion) so it can be checked under a microscope. You may need a skin biopsy if you have a skin disease or abnormal changes in your skin. Tell a health care provider about:  Any allergies you have.  All medicines you are taking, including vitamins, herbs, eye drops, creams, and over-the-counter medicines.  Any problems you or family members have had with anesthetic medicines.  Any blood disorders you have.  Any surgeries you have had.  Any medical conditions you have. What are the risks? Generally, this is a safe procedure. However, problems may occur, including:  Infection.  Bleeding.  Allergic reaction to medicines.  Scarring. What happens before the procedure?  Ask your health care provider about changing or stopping your regular medicines. This is especially important if you are taking diabetes medicines or blood thinners.  Follow instructions from your health care provider about how to care for your skin.  Ask your health care provider how your biopsy site will be marked or identified.  You may be given antibiotic medicine to prevent infection. What happens during the procedure?  To reduce your risk of infection:  Your health care team will wash or sanitize their hands.  Your skin will be washed with soap.  You may be given a medicine to help you relax (sedative).  You will be givena medicine to numb the area (local anesthetic).  The method that your health care provider will use for your skin biopsy will depend on the type of skin problem you have. Options include:  Shave biopsy. Your health care provider will shave away layers of your skin lesion with a sharp blade. After shaving, a gel or ointment may be used to control bleeding.  Punch biopsy. Your health care provider will use a tool to remove all or part of the lesion. This leaves a small hole about the width of a pencil eraser. The  area may be covered with a gel or ointment.  Excisional or incisional biopsy. Your health care provider will use a surgical blade to remove all or part of your lesion.  Your skin biopsy site may be closed with stitches (sutures).  A bandage (dressing) will be applied. The procedure may vary among health care providers and hospitals. What happens after the procedure?  Your skin sample will be sent to a laboratory for examination.  Your skin biopsy site will be watched to make sure that it stops bleeding.  Do not drive for 24 hours if you received a sedative. This information is not intended to replace advice given to you by your health care provider. Make sure you discuss any questions you have with your health care provider. Document Released: 09/17/2004 Document Revised: 04/11/2016 Document Reviewed: 11/13/2014 Elsevier Interactive Patient Education  2017 [employer]

## 2016-10-18 ENCOUNTER — Encounter: Payer: Self-pay | Admitting: Internal Medicine

## 2016-10-21 ENCOUNTER — Encounter: Payer: Self-pay | Admitting: Internal Medicine

## 2016-10-21 ENCOUNTER — Ambulatory Visit (INDEPENDENT_AMBULATORY_CARE_PROVIDER_SITE_OTHER): Payer: BLUE CROSS/BLUE SHIELD | Admitting: Internal Medicine

## 2016-10-21 ENCOUNTER — Other Ambulatory Visit (INDEPENDENT_AMBULATORY_CARE_PROVIDER_SITE_OTHER): Payer: BLUE CROSS/BLUE SHIELD

## 2016-10-21 VITALS — BP 122/74 | HR 94 | Temp 98.4°F | Resp 16 | Ht 73.0 in | Wt 246.1 lb

## 2016-10-21 DIAGNOSIS — Z4931 Encounter for adequacy testing for hemodialysis: Secondary | ICD-10-CM | POA: Diagnosis not present

## 2016-10-21 DIAGNOSIS — R5382 Chronic fatigue, unspecified: Secondary | ICD-10-CM

## 2016-10-21 DIAGNOSIS — R21 Rash and other nonspecific skin eruption: Secondary | ICD-10-CM | POA: Diagnosis not present

## 2016-10-21 LAB — CBC WITH DIFFERENTIAL/PLATELET
BASOS ABS: 0 10*3/uL (ref 0.0–0.1)
Basophils Relative: 0.5 % (ref 0.0–3.0)
EOS ABS: 0.4 10*3/uL (ref 0.0–0.7)
Eosinophils Relative: 5.7 % — ABNORMAL HIGH (ref 0.0–5.0)
HCT: 47.6 % (ref 39.0–52.0)
Hemoglobin: 16.3 g/dL (ref 13.0–17.0)
LYMPHS ABS: 2.6 10*3/uL (ref 0.7–4.0)
Lymphocytes Relative: 38.6 % (ref 12.0–46.0)
MCHC: 34.3 g/dL (ref 30.0–36.0)
MCV: 92 fl (ref 78.0–100.0)
Monocytes Absolute: 0.5 10*3/uL (ref 0.1–1.0)
Monocytes Relative: 7.3 % (ref 3.0–12.0)
NEUTROS ABS: 3.2 10*3/uL (ref 1.4–7.7)
NEUTROS PCT: 47.9 % (ref 43.0–77.0)
Platelets: 245 10*3/uL (ref 150.0–400.0)
RBC: 5.18 Mil/uL (ref 4.22–5.81)
RDW: 13.5 % (ref 11.5–15.5)
WBC: 6.7 10*3/uL (ref 4.0–10.5)

## 2016-10-21 LAB — SEDIMENTATION RATE: SED RATE: 6 mm/h (ref 0–15)

## 2016-10-21 MED ORDER — TRIAMCINOLONE ACETONIDE 0.5 % EX CREA: 1.0000 "application " | TOPICAL_CREAM | Freq: Three times a day (TID) | CUTANEOUS | 1 refills | Status: DC

## 2016-10-21 NOTE — Patient Instructions (Signed)
Rash A rash is a change in the color of the skin. A rash can also change the way your skin feels. There are many different conditions and factors that can cause a rash. Follow these instructions at home: Pay attention to any changes in your symptoms. Follow these instructions to help with your condition: Medicine Take or apply over-the-counter and prescription medicines only as told by your health care provider. These may include:  Corticosteroid cream.  Anti-itch lotions.  Oral antihistamines.  Skin Care  Apply cool compresses to the affected areas.  Try taking a bath with: ? Epsom salts. Follow the instructions on the packaging. You can get these at your local pharmacy or grocery store. ? Baking soda. Pour a small amount into the bath as told by your health care provider. ? Colloidal oatmeal. Follow the instructions on the packaging. You can get this at your local pharmacy or grocery store.  Try applying baking soda paste to your skin. Stir water into baking soda until it reaches a paste-like consistency.  Do not scratch or rub your skin.  Avoid covering the rash. Make sure the rash is exposed to air as much as possible. General instructions  Avoid hot showers or baths, which can make itching worse. A cold shower may help.  Avoid scented soaps, detergents, and perfumes. Use gentle soaps, detergents, perfumes, and other cosmetic products.  Avoid any substance that causes your rash. Keep a journal to help track what causes your rash. Write down: ? What you eat. ? What cosmetic products you use. ? What you drink. ? What you wear. This includes jewelry.  Keep all follow-up visits as told by your health care provider. This is important. Contact a health care provider if:  You sweat at night.  You lose weight.  You urinate more than normal.  You feel weak.  You vomit.  Your skin or the whites of your eyes look yellow (jaundice).  Your skin: ? Tingles. ? Is  numb.  Your rash: ? Does not go away after several days. ? Gets worse.  You are: ? Unusually thirsty. ? More tired than normal.  You have: ? New symptoms. ? Pain in your abdomen. ? A fever. ? Diarrhea. Get help right away if:  You develop a rash that covers all or most of your body. The rash may or may not be painful.  You develop blisters that: ? Are on top of the rash. ? Grow larger or grow together. ? Are painful. ? Are inside your nose or mouth.  You develop a rash that: ? Looks like purple pinprick-sized spots all over your body. ? Has a "bull's eye" or looks like a target. ? Is not related to sun exposure, is red and painful, and causes your skin to peel. This information is not intended to replace advice given to you by your health care provider. Make sure you discuss any questions you have with your health care provider. Document Released: 08/06/2002 Document Revised: 01/20/2016 Document Reviewed: 01/01/2015 Elsevier Interactive Patient Education  2017 Elsevier Inc.  

## 2016-10-21 NOTE — Progress Notes (Signed)
Pre visit review using our clinic review tool, if applicable. No additional management support is needed unless otherwise documented below in the visit note. 

## 2016-10-21 NOTE — Progress Notes (Signed)
Subjective:  Patient ID: Leon Carroll, male    DOB: [birth year]  Age: 41 y.o. MRN: [id number]  CC: Rash   HPI Mario Coronado Petrosky presents for f/up after undergoing a skin bx 1 week ago, the wound has healed nicely but the rash persiss yet it does not cause any symptoms.  Outpatient Medications Prior to Visit  Medication Sig Dispense Refill  . albuterol (PROVENTIL HFA;VENTOLIN HFA) 108 (90 Base) MCG/ACT inhaler Inhale 2 puffs into the lungs every 6 (six) hours as needed for wheezing or shortness of breath. 1 Inhaler 0  . atomoxetine (STRATTERA) 10 MG capsule Take 1 capsule (10 mg total) by mouth daily. 21 capsule 0  . Cholecalciferol 2000 units TABS Take 1 tablet (2,000 Units total) by mouth daily. 90 tablet 3   No facility-administered medications prior to visit.     ROS Review of Systems  Constitutional: Negative.   Skin: Positive for rash and wound.  Psychiatric/Behavioral: The patient is hyperactive.   All other systems reviewed and are negative.   Objective:  BP 122/74 (BP Location: Left Arm, Patient Position: Sitting, Cuff Size: Large)   Pulse 94   Temp 98.4 F (36.9 C) (Oral)   Resp 16   Ht 6' 1"  (1.854 m)   Wt 246 lb 1.3 oz (111.6 kg)   SpO2 97%   BMI 32.47 kg/m   BP Readings from Last 3 Encounters:  10/21/16 122/74  10/14/16 128/82  09/03/16 140/80    Wt Readings from Last 3 Encounters:  10/21/16 246 lb 1.3 oz (111.6 kg)  10/14/16 245 lb 1.3 oz (111.2 kg)  09/03/16 242 lb (109.8 kg)    Physical Exam  Skin: Rash noted. No bruising, no ecchymosis and no purpura noted. Rash is papular. Rash is not macular, not maculopapular, not nodular, not pustular, not vesicular and not urticarial. There is erythema.       Lab Results  Component Value Date   WBC 7.3 09/03/2016   HGB 15.3 09/03/2016   HCT 44.5 09/03/2016   PLT 233.0 09/03/2016   GLUCOSE 69 (L) 09/03/2016   CHOL 183 10/16/2015   TRIG 81.0 10/16/2015   HDL 36.60 (L) 10/16/2015   LDLDIRECT 173.0 08/02/2013   LDLCALC 130 (H) 10/16/2015   ALT 30 09/03/2016   AST 25 09/03/2016   NA 139 09/03/2016   K 3.9 09/03/2016   CL 104 09/03/2016   CREATININE 1.14 09/03/2016   BUN 9 09/03/2016   CO2 30 09/03/2016   TSH 1.86 09/03/2016    Dg Chest 2 View  Result Date: 09/03/2016 CLINICAL DATA:  Shortness of breath and chest tightness for 4 days. EXAM: CHEST  2 VIEW COMPARISON:  PA and lateral chest 07/01/2014. FINDINGS: The lungs are clear. Heart size is normal. No pneumothorax or pleural fluid. IMPRESSION: Negative chest. Electronically Signed   By: Inge Rise M.D.   On: 09/03/2016 16:22   BIOPSY RESULTS  Skin , right calf, punch INTERFACE DERMATITIS, SEE DESCRIPTION Microscopic Description There is a mild superficial perivascular lymphocytic infiltrate associated with scattered necrotic keratinocytes at the dermoepidermal junction. Extravasated red blood cells are seen in the superficial dermis. The histologic differential diagnosis in this setting includes a drug reaction and early chronic pigmented purpura. Correlation with the clinical findings is recommended. A special stain (PAS-F) is performed to determine the presence or absence of fungal hyphae in this biopsy. The PAS stain is negative for fungal hyphae, and the controls stained appropriately  Assessment & Plan:  [place] was seen today for rash.  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 Will treat the rash with topical steroids. -     CBC with Differential/Platelet; Future -     B. burgdorfi antibodies by WB; Future -     Lyme Ab/Western Blot Reflex; Future -     Sedimentation rate; Future   I am having Mr. Nobel maintain his Cholecalciferol, albuterol, and atomoxetine.  No orders of the defined types were placed in this  encounter.    Follow-up: No Follow-up on file.  Scarlette Calico, MD

## 2016-10-22 ENCOUNTER — Encounter: Payer: Self-pay | Admitting: Internal Medicine

## 2016-10-22 LAB — LYME ABY, WSTRN BLT IGG & IGM W/BANDS
B BURGDORFERI IGG ABS (IB): NEGATIVE
B burgdorferi IgM Abs (IB): NEGATIVE
LYME DISEASE 28 KD IGG: NONREACTIVE
LYME DISEASE 30 KD IGG: NONREACTIVE
LYME DISEASE 39 KD IGG: NONREACTIVE
LYME DISEASE 39 KD IGM: NONREACTIVE
LYME DISEASE 41 KD IGG: NONREACTIVE
LYME DISEASE 45 KD IGG: NONREACTIVE
LYME DISEASE 66 KD IGG: NONREACTIVE
LYME DISEASE 93 KD IGG: NONREACTIVE
Lyme Disease 18 kD IgG: NONREACTIVE
Lyme Disease 23 kD IgG: NONREACTIVE
Lyme Disease 23 kD IgM: NONREACTIVE
Lyme Disease 41 kD IgM: NONREACTIVE
Lyme Disease 58 kD IgG: NONREACTIVE

## 2016-10-22 LAB — LYME AB/WESTERN BLOT REFLEX: B burgdorferi Ab IgG+IgM: <0.9 Index (ref <0.90)

## 2016-10-23 LAB — TESTOSTERONE, FREE, TOTAL, SHBG
Sex Hormone Binding: 26.4 nmol/L (ref 16.5–55.9)
Testosterone, Free: 6.7 pg/mL — ABNORMAL LOW (ref 6.8–21.5)
Testosterone: 283 ng/dL (ref 264–916)

## 2016-10-25 ENCOUNTER — Other Ambulatory Visit: Payer: Self-pay | Admitting: Internal Medicine

## 2016-10-25 DIAGNOSIS — E291 Testicular hypofunction: Secondary | ICD-10-CM

## 2016-10-25 MED ORDER — TESTOSTERONE 5.5 MG/ACT NA GEL: 2.0000 | Freq: Two times a day (BID) | NASAL | 5 refills | Status: DC

## 2016-10-26 ENCOUNTER — Other Ambulatory Visit: Payer: Self-pay | Admitting: Internal Medicine

## 2016-10-26 DIAGNOSIS — E291 Testicular hypofunction: Secondary | ICD-10-CM

## 2016-10-26 MED ORDER — TESTOSTERONE 5.5 MG/ACT NA GEL: 2.0000 | Freq: Two times a day (BID) | NASAL | 1 refills | Status: DC

## 2016-11-02 ENCOUNTER — Other Ambulatory Visit: Payer: Self-pay | Admitting: Internal Medicine

## 2016-11-02 ENCOUNTER — Encounter: Payer: Self-pay | Admitting: Internal Medicine

## 2016-11-02 DIAGNOSIS — E291 Testicular hypofunction: Secondary | ICD-10-CM

## 2016-11-02 MED ORDER — TESTOSTERONE 20.25 MG/ACT (1.62%) TD GEL: 2.0000 | Freq: Every day | TRANSDERMAL | 5 refills | Status: DC

## 2016-11-03 ENCOUNTER — Encounter: Payer: Self-pay | Admitting: Internal Medicine

## 2016-11-03 NOTE — Progress Notes (Signed)
error 

## 2016-11-05 NOTE — Telephone Encounter (Signed)
Key: [id number]

## 2016-11-11 ENCOUNTER — Telehealth: Payer: Self-pay | Admitting: Internal Medicine

## 2016-11-11 DIAGNOSIS — E291 Testicular hypofunction: Secondary | ICD-10-CM

## 2016-11-11 NOTE — Telephone Encounter (Signed)
Patient states insurance denied PA on astrogel.  Is requesting script for something else in place.

## 2016-11-15 ENCOUNTER — Other Ambulatory Visit: Payer: Self-pay | Admitting: Internal Medicine

## 2016-11-15 DIAGNOSIS — E291 Testicular hypofunction: Secondary | ICD-10-CM

## 2016-11-15 MED ORDER — TESTOSTERONE CYPIONATE 200 MG/ML IM SOLN: 100.0000 mg | INTRAMUSCULAR | 1 refills | Status: DC

## 2016-11-15 NOTE — Telephone Encounter (Signed)
RX written 

## 2016-11-15 NOTE — Telephone Encounter (Signed)
testosterone cypionate iminj in oil 200 mg/ml    May be covered.

## 2016-11-18 NOTE — Telephone Encounter (Addendum)
[employer] called and stated that needed Two earlier morning labs (prior to 10 am) on different dates and have to show a low level.   Questions also needed were: symptoms of testosterone deficiency. Answered that patient is positive.

## 2016-11-18 NOTE — Telephone Encounter (Signed)
Started another PA for testosterone gel 1%.  Pt refused the injection.

## 2016-11-22 ENCOUNTER — Other Ambulatory Visit: Payer: Self-pay | Admitting: Internal Medicine

## 2016-11-22 DIAGNOSIS — E291 Testicular hypofunction: Secondary | ICD-10-CM

## 2016-11-23 ENCOUNTER — Other Ambulatory Visit: Payer: BLUE CROSS/BLUE SHIELD

## 2016-11-23 DIAGNOSIS — E291 Testicular hypofunction: Secondary | ICD-10-CM

## 2016-11-23 NOTE — Addendum Note (Signed)
Addended by: Verlan FriendsAIRRIKIER DAVIDSON, Xai Frerking M on: 11/23/2016 10:38 AM   Modules accepted: Orders

## 2016-11-24 ENCOUNTER — Other Ambulatory Visit: Payer: BLUE CROSS/BLUE SHIELD

## 2016-11-24 ENCOUNTER — Encounter: Payer: Self-pay | Admitting: Internal Medicine

## 2016-11-24 DIAGNOSIS — Z0000 Encounter for general adult medical examination without abnormal findings: Secondary | ICD-10-CM

## 2016-11-24 LAB — TESTOSTERONE TOTAL,FREE,BIO, MALES
Albumin: 4 g/dL (ref 3.6–5.1)
SEX HORMONE BINDING: 25 nmol/L (ref 10–50)
TESTOSTERONE FREE: 78.9 pg/mL (ref 46.0–224.0)
Testosterone, Bioavailable: 145.2 ng/dL (ref 110.0–575.0)
Testosterone: 455 ng/dL (ref 250–827)

## 2016-11-25 ENCOUNTER — Encounter: Payer: Self-pay | Admitting: Internal Medicine

## 2016-11-25 LAB — TESTOSTERONE TOTAL,FREE,BIO, MALES
Albumin: 4.1 g/dL (ref 3.6–5.1)
Sex Hormone Binding: 25 nmol/L (ref 10–50)
TESTOSTERONE: 521 ng/dL (ref 250–827)
Testosterone, Bioavailable: 172.8 ng/dL (ref 110.0–575.0)
Testosterone, Free: 91.8 pg/mL (ref 46.0–224.0)

## 2016-12-02 NOTE — Telephone Encounter (Signed)
Can you start a PA for testosterone gel

## 2016-12-03 ENCOUNTER — Telehealth: Payer: Self-pay

## 2016-12-03 NOTE — Telephone Encounter (Signed)
PA for testosterone has been initiated.   [id number]

## 2016-12-07 ENCOUNTER — Telehealth: Payer: Self-pay | Admitting: Internal Medicine

## 2016-12-07 NOTE — Telephone Encounter (Signed)
Resubmitted verbal PA for the angro gel 1.62%

## 2016-12-07 NOTE — Telephone Encounter (Signed)
testosterone cypionate (DEPOTESTOSTERONE CYPIONATE) 200 MG/ML injection   Missing collection times for the test labs that were submitted.  And has more questions about questions there weren't answered on origanal form.  Denyse Amass [employer] [phone]

## 2016-12-10 NOTE — Telephone Encounter (Signed)
PA for Angrogel denied.   Pt does not want to use injectables and the nasal spray irritates his nose. Testosterone levels were redrawn and did not show deficiency.   No other options as this time.   Forwarding to PCP as FYI.

## 2017-03-28 ENCOUNTER — Encounter: Payer: Self-pay | Admitting: Internal Medicine

## 2017-03-28 ENCOUNTER — Other Ambulatory Visit: Payer: Self-pay | Admitting: Internal Medicine

## 2017-03-28 DIAGNOSIS — F900 Attention-deficit hyperactivity disorder, predominantly inattentive type: Secondary | ICD-10-CM

## 2017-03-28 MED ORDER — LISDEXAMFETAMINE DIMESYLATE 30 MG PO CAPS: 30.0000 mg | ORAL_CAPSULE | Freq: Every day | ORAL | 0 refills | Status: DC

## 2017-03-30 ENCOUNTER — Telehealth: Payer: Self-pay

## 2017-03-30 NOTE — Telephone Encounter (Signed)
KEY: [id number]  PA is approved.

## 2017-05-10 ENCOUNTER — Encounter: Payer: Self-pay | Admitting: Internal Medicine

## 2017-05-11 ENCOUNTER — Other Ambulatory Visit: Payer: Self-pay | Admitting: Internal Medicine

## 2017-05-11 DIAGNOSIS — F900 Attention-deficit hyperactivity disorder, predominantly inattentive type: Secondary | ICD-10-CM

## 2017-05-12 ENCOUNTER — Other Ambulatory Visit: Payer: Self-pay | Admitting: Internal Medicine

## 2017-05-12 DIAGNOSIS — F900 Attention-deficit hyperactivity disorder, predominantly inattentive type: Secondary | ICD-10-CM

## 2017-05-12 MED ORDER — LISDEXAMFETAMINE DIMESYLATE 30 MG PO CAPS
30.0000 mg | ORAL_CAPSULE | Freq: Every day | ORAL | 0 refills | Status: DC
Start: 2017-05-12 — End: 2017-06-29

## 2017-05-12 NOTE — Telephone Encounter (Signed)
Place script up front for pick-up.../lmb 

## 2017-06-29 ENCOUNTER — Other Ambulatory Visit: Payer: Self-pay | Admitting: Internal Medicine

## 2017-06-29 ENCOUNTER — Encounter: Payer: Self-pay | Admitting: Internal Medicine

## 2017-06-29 DIAGNOSIS — F900 Attention-deficit hyperactivity disorder, predominantly inattentive type: Secondary | ICD-10-CM

## 2017-06-29 MED ORDER — LISDEXAMFETAMINE DIMESYLATE 30 MG PO CAPS
30.0000 mg | ORAL_CAPSULE | Freq: Every day | ORAL | 0 refills | Status: DC
Start: 2017-06-29 — End: 2017-08-15

## 2017-08-15 ENCOUNTER — Encounter: Payer: Self-pay | Admitting: Internal Medicine

## 2017-08-15 ENCOUNTER — Other Ambulatory Visit: Payer: Self-pay | Admitting: Internal Medicine

## 2017-08-15 DIAGNOSIS — F900 Attention-deficit hyperactivity disorder, predominantly inattentive type: Secondary | ICD-10-CM

## 2017-08-15 MED ORDER — LISDEXAMFETAMINE DIMESYLATE 30 MG PO CAPS
30.0000 mg | ORAL_CAPSULE | Freq: Every day | ORAL | 0 refills | Status: DC
Start: 2017-08-15 — End: 2017-11-08

## 2017-10-30 ENCOUNTER — Encounter: Payer: Self-pay | Admitting: Internal Medicine

## 2017-10-31 ENCOUNTER — Other Ambulatory Visit: Payer: Self-pay | Admitting: Internal Medicine

## 2017-10-31 DIAGNOSIS — B001 Herpesviral vesicular dermatitis: Secondary | ICD-10-CM | POA: Insufficient documentation

## 2017-10-31 MED ORDER — VALACYCLOVIR HCL 1 G PO TABS: 1000.0000 mg | ORAL_TABLET | Freq: Two times a day (BID) | ORAL | 2 refills | Status: DC

## 2017-11-02 ENCOUNTER — Ambulatory Visit: Payer: 59 | Admitting: Family

## 2017-11-02 ENCOUNTER — Encounter: Payer: Self-pay | Admitting: Family

## 2017-11-02 VITALS — BP 140/80 | HR 77 | Temp 98.5°F | Ht 73.0 in | Wt 245.0 lb

## 2017-11-02 DIAGNOSIS — M62838 Other muscle spasm: Secondary | ICD-10-CM | POA: Diagnosis not present

## 2017-11-02 DIAGNOSIS — F900 Attention-deficit hyperactivity disorder, predominantly inattentive type: Secondary | ICD-10-CM | POA: Diagnosis not present

## 2017-11-02 MED ORDER — ATOMOXETINE HCL 10 MG PO CAPS: 10.0000 mg | ORAL_CAPSULE | Freq: Every day | ORAL | 0 refills | Status: DC

## 2017-11-02 MED ORDER — METHOCARBAMOL 500 MG PO TABS: 500.0000 mg | ORAL_TABLET | Freq: Three times a day (TID) | ORAL | 0 refills | Status: DC | PRN

## 2017-11-02 NOTE — Progress Notes (Signed)
Leon Carroll is a 42 y.o. male with the following history as recorded in EpicCare:  Patient Active Problem List   Diagnosis Date Noted  . Herpes simplex labialis 10/31/2017  . Hypogonadism male 10/25/2016  . Encounter for vasectomy 04/13/2016  . Chronic fatigue 01/28/2016  . Vitamin D deficiency 01/28/2016  . Hyperlipidemia with target LDL less than 130 09/19/2015  . Insomnia 11/15/2013  . Routine general medical examination at a health care facility 08/02/2013  . Essential hypertension, benign 08/02/2013  . Constipation 08/02/2013  . ADHD (attention deficit hyperactivity disorder), inattentive type 08/02/2013    Current Outpatient Medications  Medication Sig Dispense Refill  . valACYclovir (VALTREX) 1000 MG tablet Take 1 tablet (1,000 mg total) by mouth 2 (two) times daily for 5 days. 14 tablet 2  . atomoxetine (STRATTERA) 10 MG capsule Take 1 capsule (10 mg total) by mouth daily. 30 capsule 0  . Cholecalciferol 2000 units TABS Take 1 tablet (2,000 Units total) by mouth daily. (Patient not taking: Reported on 11/02/2017) 90 tablet 3  . lisdexamfetamine (VYVANSE) 30 MG capsule Take 1 capsule (30 mg total) by mouth daily. (Patient not taking: Reported on 11/02/2017) 30 capsule 0  . methocarbamol (ROBAXIN) 500 MG tablet Take 1 tablet (500 mg total) by mouth every 8 (eight) hours as needed for muscle spasms. 30 tablet 0  . Testosterone 20.25 MG/ACT (1.62%) GEL Place 2 Act onto the skin daily. (Patient not taking: Reported on 11/02/2017) 75 g 5  . triamcinolone cream (KENALOG) 0.5 % Apply 1 application topically 3 (three) times daily. (Patient not taking: Reported on 11/02/2017) 30 g 1   No current facility-administered medications for this visit.     Allergies: Vyvanse [lisdexamfetamine dimesylate] and Azithromycin  Past Medical History:  Diagnosis Date  . ADHD (attention deficit hyperactivity disorder), inattentive type 08/02/2013  . Essential hypertension, benign 08/02/2013  .  Hyperlipidemia with target LDL less than 130 09/19/2015  . Vitamin D deficiency 01/28/2016    History reviewed. No pertinent surgical history.  Family History  Problem Relation Age of Onset  . Bladder Cancer Father   . Hypertension Father   . Stroke Neg Hx   . Cancer Neg Hx   . Diabetes Neg Hx   . Early death Neg Hx   . Heart disease Neg Hx   . Hyperlipidemia Neg Hx   . Kidney disease Neg Hx   . Arthritis Neg Hx     Social History   Tobacco Use  . Smoking status: Former Games developer  . Smokeless tobacco: Never Used  Substance Use Topics  . Alcohol use: No    Subjective:  Patient notes that had flu-like/ cough symptoms last week; notes that entire family had the flu; states he was coughing so hard, he feels like he pulled a muscle in his low back; notes that pain is persisting; worse with movement; has tried using OTC [facility] with no benefit; denies any chest pain or shortness of breath; no numbness/ tingling into lower extremities;   Of note, patient stopped his Vyvanse about 2 weeks ago due to concerns for blood pressure being elevated/ "jaw clinching." Would like to discuss other options besides stimulant drugs;   Objective:  Vitals:   11/02/17 1621  BP: 140/80  Pulse: 77  Temp: 98.5 F (36.9 C)  TempSrc: Oral  SpO2: 99%  Weight: 245 lb (111.1 kg)  Height: 6\' 1"  (1.854 m)    General: Well developed, well nourished, in no acute distress  Skin :  Warm and dry.  Head: Normocephalic and atraumatic  Lungs: Respirations unlabored; clear to auscultation bilaterally without wheeze, rales, rhonchi  CVS exam: normal rate and regular rhythm.  Musculoskeletal: No deformities; no active joint inflammation  Extremities: No edema, cyanosis, clubbing  Vessels: Symmetric bilaterally  Neurologic: Alert and oriented; speech intact; face symmetrical; moves all extremities well; CNII-XII intact without focal deficit   Assessment:  1. Muscle spasm   2. ADHD (attention deficit hyperactivity  disorder), inattentive type     Plan:  1. Rx for Robaxin 500 mg tid prn; continue Naproxen for anti-inflammatory benefit; follow-up worse, no better; 2. Trial of Strattera as patient is concerned about side effects with stimulants; follow-up with his PCP;   No Follow-up on file.  No orders of the defined types were placed in this encounter.   Requested Prescriptions   Signed Prescriptions Disp Refills  . methocarbamol (ROBAXIN) 500 MG tablet 30 tablet 0    Sig: Take 1 tablet (500 mg total) by mouth every 8 (eight) hours as needed for muscle spasms.  Marland Kitchen. atomoxetine (STRATTERA) 10 MG capsule 30 capsule 0    Sig: Take 1 capsule (10 mg total) by mouth daily.

## 2017-11-03 ENCOUNTER — Encounter: Payer: Self-pay | Admitting: Internal Medicine

## 2017-11-08 ENCOUNTER — Other Ambulatory Visit: Payer: Self-pay | Admitting: Internal Medicine

## 2017-11-08 DIAGNOSIS — F900 Attention-deficit hyperactivity disorder, predominantly inattentive type: Secondary | ICD-10-CM

## 2017-11-08 MED ORDER — AMPHETAMINE-DEXTROAMPHET ER 30 MG PO CP24: 30.0000 mg | ORAL_CAPSULE | ORAL | 0 refills | Status: DC

## 2017-11-29 ENCOUNTER — Encounter: Payer: Self-pay | Admitting: Internal Medicine

## 2017-11-29 ENCOUNTER — Other Ambulatory Visit: Payer: Self-pay | Admitting: Internal Medicine

## 2017-12-12 ENCOUNTER — Other Ambulatory Visit: Payer: Self-pay | Admitting: Internal Medicine

## 2017-12-12 DIAGNOSIS — F900 Attention-deficit hyperactivity disorder, predominantly inattentive type: Secondary | ICD-10-CM

## 2017-12-12 MED ORDER — LISDEXAMFETAMINE DIMESYLATE 30 MG PO CAPS
30.0000 mg | ORAL_CAPSULE | Freq: Every day | ORAL | 0 refills | Status: DC
Start: 2017-12-12 — End: 2018-05-23

## 2018-03-20 DIAGNOSIS — S62356A Nondisplaced fracture of shaft of fifth metacarpal bone, right hand, initial encounter for closed fracture: Secondary | ICD-10-CM | POA: Diagnosis not present

## 2018-03-21 ENCOUNTER — Ambulatory Visit: Payer: 59 | Admitting: Family

## 2018-05-22 ENCOUNTER — Encounter (INDEPENDENT_AMBULATORY_CARE_PROVIDER_SITE_OTHER): Payer: Self-pay

## 2018-05-22 ENCOUNTER — Telehealth: Payer: Self-pay | Admitting: Emergency Medicine

## 2018-05-22 NOTE — Telephone Encounter (Signed)
Pt needs an appt

## 2018-05-22 NOTE — Telephone Encounter (Signed)
Patient scheduled.

## 2018-05-22 NOTE — Telephone Encounter (Signed)
Pt is requesting a refill on his lisdexamfetamine (VYVANSE) 30 MG capsule. [facility]acy [facility] [address] Thanks.

## 2018-05-23 ENCOUNTER — Other Ambulatory Visit: Payer: Self-pay | Admitting: Internal Medicine

## 2018-05-23 DIAGNOSIS — F900 Attention-deficit hyperactivity disorder, predominantly inattentive type: Secondary | ICD-10-CM

## 2018-05-23 MED ORDER — LISDEXAMFETAMINE DIMESYLATE 30 MG PO CAPS
30.0000 mg | ORAL_CAPSULE | Freq: Every day | ORAL | 0 refills | Status: DC
Start: 2018-05-23 — End: 2018-06-29

## 2018-05-23 NOTE — Telephone Encounter (Signed)
Pt requesting refill for Vyvanse 30 mg. Pt has been scheduled for follow up.

## 2018-05-24 ENCOUNTER — Other Ambulatory Visit (INDEPENDENT_AMBULATORY_CARE_PROVIDER_SITE_OTHER): Payer: 59

## 2018-05-24 ENCOUNTER — Encounter: Payer: Self-pay | Admitting: Internal Medicine

## 2018-05-24 ENCOUNTER — Ambulatory Visit: Payer: 59 | Admitting: Internal Medicine

## 2018-05-24 VITALS — BP 140/88 | HR 85 | Temp 98.5°F | Resp 16 | Ht 73.0 in | Wt 246.0 lb

## 2018-05-24 DIAGNOSIS — I10 Essential (primary) hypertension: Secondary | ICD-10-CM

## 2018-05-24 DIAGNOSIS — E785 Hyperlipidemia, unspecified: Secondary | ICD-10-CM

## 2018-05-24 DIAGNOSIS — Z0000 Encounter for general adult medical examination without abnormal findings: Secondary | ICD-10-CM

## 2018-05-24 DIAGNOSIS — Z23 Encounter for immunization: Secondary | ICD-10-CM

## 2018-05-24 DIAGNOSIS — E559 Vitamin D deficiency, unspecified: Secondary | ICD-10-CM

## 2018-05-24 LAB — CBC WITH DIFFERENTIAL/PLATELET
BASOS ABS: 0 10*3/uL (ref 0.0–0.1)
Basophils Relative: 0.6 % (ref 0.0–3.0)
Eosinophils Absolute: 0.2 10*3/uL (ref 0.0–0.7)
Eosinophils Relative: 2.6 % (ref 0.0–5.0)
HCT: 46.8 % (ref 39.0–52.0)
HEMOGLOBIN: 16.1 g/dL (ref 13.0–17.0)
LYMPHS PCT: 33.1 % (ref 12.0–46.0)
Lymphs Abs: 2.1 10*3/uL (ref 0.7–4.0)
MCHC: 34.5 g/dL (ref 30.0–36.0)
MCV: 90 fl (ref 78.0–100.0)
MONOS PCT: 7.2 % (ref 3.0–12.0)
Monocytes Absolute: 0.5 10*3/uL (ref 0.1–1.0)
Neutro Abs: 3.6 10*3/uL (ref 1.4–7.7)
Neutrophils Relative %: 56.5 % (ref 43.0–77.0)
Platelets: 250 10*3/uL (ref 150.0–400.0)
RBC: 5.2 Mil/uL (ref 4.22–5.81)
RDW: 12.4 % (ref 11.5–15.5)
WBC: 6.4 10*3/uL (ref 4.0–10.5)

## 2018-05-24 LAB — URINALYSIS, ROUTINE W REFLEX MICROSCOPIC
Bilirubin Urine: NEGATIVE
Hgb urine dipstick: NEGATIVE
Ketones, ur: NEGATIVE
Leukocytes, UA: NEGATIVE
Nitrite: NEGATIVE
RBC / HPF: NONE SEEN (ref 0–?)
Total Protein, Urine: NEGATIVE
Urine Glucose: NEGATIVE
Urobilinogen, UA: 0.2 (ref 0.0–1.0)
pH: 6.5 (ref 5.0–8.0)

## 2018-05-24 LAB — COMPREHENSIVE METABOLIC PANEL
ALT: 21 U/L (ref 0–53)
AST: 20 U/L (ref 0–37)
Albumin: 4.8 g/dL (ref 3.5–5.2)
Alkaline Phosphatase: 53 U/L (ref 39–117)
BILIRUBIN TOTAL: 1.6 mg/dL — AB (ref 0.2–1.2)
BUN: 11 mg/dL (ref 6–23)
CO2: 29 mEq/L (ref 19–32)
CREATININE: 1.16 mg/dL (ref 0.40–1.50)
Calcium: 9.7 mg/dL (ref 8.4–10.5)
Chloride: 103 mEq/L (ref 96–112)
GFR: 73.5 mL/min (ref >60.00)
Glucose, Bld: 102 mg/dL — ABNORMAL HIGH (ref 70–99)
Potassium: 4.6 mEq/L (ref 3.5–5.1)
Sodium: 139 mEq/L (ref 135–145)
TOTAL PROTEIN: 7.9 g/dL (ref 6.0–8.3)

## 2018-05-24 LAB — TSH: TSH: 1.67 u[IU]/mL (ref 0.35–4.50)

## 2018-05-24 LAB — LIPID PANEL
Cholesterol: 203 mg/dL — ABNORMAL HIGH (ref 0–200)
HDL: 37.8 mg/dL — ABNORMAL LOW (ref >39.00)
LDL Cholesterol: 142 mg/dL — ABNORMAL HIGH (ref 0–99)
NonHDL: 165.66
Total CHOL/HDL Ratio: 5
Triglycerides: 117 mg/dL (ref 0.0–149.0)
VLDL: 23.4 mg/dL (ref 0.0–40.0)

## 2018-05-24 LAB — VITAMIN D 25 HYDROXY (VIT D DEFICIENCY, FRACTURES): VITD: 26.63 ng/mL — ABNORMAL LOW (ref 30.00–100.00)

## 2018-05-24 MED ORDER — CHOLECALCIFEROL 50 MCG (2000 UT) PO TABS: 2.0000 | ORAL_TABLET | Freq: Every day | ORAL | 1 refills | Status: DC

## 2018-05-24 MED ORDER — AZILSARTAN MEDOXOMIL 40 MG PO TABS: 1.0000 | ORAL_TABLET | Freq: Every day | ORAL | 1 refills | Status: DC

## 2018-05-24 NOTE — Patient Instructions (Signed)

## 2018-05-24 NOTE — Progress Notes (Signed)
Subjective:  Patient ID: Leon Carroll, male    DOB: November 10, 1975  Age: 42 y.o. MRN: [id number]  CC: Annual Exam and Hypertension   HPI Leon Carroll presents for a CPX.  Pt states ADD status overall stable on current meds with overall good compliance and tolerability, and good effectiveness with respect to ability for concentration and task completion.  He is concerned that his blood pressure is not adequately well controlled.  He plays tennis and denies any episodes of DOE, CP, palpitations, or edema.  He complains of chronic fatigue.  Outpatient Medications Prior to Visit  Medication Sig Dispense Refill  . lisdexamfetamine (VYVANSE) 30 MG capsule Take 1 capsule (30 mg total) by mouth daily. 30 capsule 0  . Cholecalciferol 2000 units TABS Take 1 tablet (2,000 Units total) by mouth daily. (Patient not taking: Reported on 11/02/2017) 90 tablet 3   No facility-administered medications prior to visit.     ROS Review of Systems  Constitutional: Positive for fatigue. Negative for diaphoresis and unexpected weight change.  HENT: Negative.   Eyes: Negative for visual disturbance.  Respiratory: Negative for apnea, cough, chest tightness, shortness of breath and wheezing.   Cardiovascular: Negative for chest pain, palpitations and leg swelling.  Gastrointestinal: Negative for abdominal pain, constipation, diarrhea, nausea and vomiting.  Endocrine: Negative.   Genitourinary: Negative.  Negative for difficulty urinating, penile swelling, scrotal swelling and testicular pain.  Musculoskeletal: Negative.  Negative for arthralgias and myalgias.  Skin: Negative for color change and rash.  Neurological: Negative for dizziness, weakness, light-headedness and headaches.  Hematological: Negative for adenopathy. Does not bruise/bleed easily.  Psychiatric/Behavioral: Positive for decreased concentration. Negative for agitation, behavioral problems, confusion, dysphoric mood, self-injury,  sleep disturbance and suicidal ideas. The patient is not nervous/anxious.     Objective:  BP 140/88 (BP Location: Left Arm, Patient Position: Sitting, Cuff Size: Large)   Pulse 85   Temp 98.5 F (36.9 C) (Oral)   Resp 16   Ht 6\' 1"  (1.854 m)   Wt 246 lb (111.6 kg)   SpO2 98%   BMI 32.46 kg/m   BP Readings from Last 3 Encounters:  05/24/18 140/88  11/02/17 140/80  10/21/16 122/74    Wt Readings from Last 3 Encounters:  05/24/18 246 lb (111.6 kg)  11/02/17 245 lb (111.1 kg)  10/21/16 246 lb 1.3 oz (111.6 kg)    Physical Exam  Constitutional: He is oriented to person, place, and time. No distress.  HENT:  Mouth/Throat: Oropharynx is clear and moist. No oropharyngeal exudate.  Eyes: Conjunctivae are normal. No scleral icterus.  Neck: Normal range of motion. Neck supple. No JVD present. No thyromegaly present.  Cardiovascular: Normal rate, regular rhythm and normal heart sounds. Exam reveals no gallop.  No murmur heard. Pulmonary/Chest: Effort normal and breath sounds normal. He has no wheezes. He has no rales.  Abdominal: Soft. Normal appearance and bowel sounds are normal. There is no hepatosplenomegaly. There is no tenderness.  Musculoskeletal: Normal range of motion. He exhibits no edema, tenderness or deformity.  Lymphadenopathy:    He has no cervical adenopathy.  Neurological: He is alert and oriented to person, place, and time.  Skin: Skin is warm and dry. No rash noted. He is not diaphoretic.  Psychiatric: He has a normal mood and affect. His behavior is normal. Judgment and thought content normal.  Vitals reviewed.   Lab Results  Component Value Date   WBC 6.4 05/24/2018   HGB 16.1 05/24/2018   HCT  46.8 05/24/2018   PLT 250.0 05/24/2018   GLUCOSE 102 (H) 05/24/2018   CHOL 203 (H) 05/24/2018   TRIG 117.0 05/24/2018   HDL 37.80 (L) 05/24/2018   LDLDIRECT 173.0 08/02/2013   LDLCALC 142 (H) 05/24/2018   ALT 21 05/24/2018   AST 20 05/24/2018   NA 139  05/24/2018   K 4.6 05/24/2018   CL 103 05/24/2018   CREATININE 1.16 05/24/2018   BUN 11 05/24/2018   CO2 29 05/24/2018   TSH 1.67 05/24/2018    Dg Chest 2 View  Result Date: 09/03/2016 CLINICAL DATA:  Shortness of breath and chest tightness for 4 days. EXAM: CHEST  2 VIEW COMPARISON:  PA and lateral chest 07/01/2014. FINDINGS: The lungs are clear. Heart size is normal. No pneumothorax or pleural fluid. IMPRESSION: Negative chest. Electronically Signed   By: Drusilla Kanner M.D.   On: 09/03/2016 16:22    Assessment & Plan:   Leon Carroll was seen today for annual exam and hypertension.  Diagnoses and all orders for this visit:  Routine general medical examination at a health care facility- Exam completed, labs reviewed, vaccines reviewed and updated, patient education material was given. -     Lipid panel; Future  Vitamin D deficiency -     VITAMIN D 25 Hydroxy (Vit-D Deficiency, Fractures); Future -     Cholecalciferol 2000 units TABS; Take 2 tablets (4,000 Units total) by mouth daily.  Essential hypertension, benign- His blood pressure is not adequately well controlled.  I will treat the vitamin D deficiency.  Otherwise, his labs are negative for secondary causes or endorgan damage.  Will start treating this with an ARB. -     CBC with Differential/Platelet; Future -     Comprehensive metabolic panel; Future -     VITAMIN D 25 Hydroxy (Vit-D Deficiency, Fractures); Future -     Urinalysis, Routine w reflex microscopic; Future -     TSH; Future -     Azilsartan Medoxomil (EDARBI) 40 MG TABS; Take 1 tablet by mouth daily.  Need for influenza vaccination -     Flu Vaccine QUAD 36+ mos IM  Need for Tdap vaccination -     Tdap vaccine greater than or equal to 7yo IM  Hyperlipidemia with target LDL less than 130- He does not have an elevated ASCVD risk score so I do not recommend a statin for CV risk reduction.   I have discontinued [name]. Stickler's Cholecalciferol. I am also having  him start on Azilsartan Medoxomil and Cholecalciferol. Additionally, I am having him maintain his lisdexamfetamine.  Meds ordered this encounter  Medications  . Azilsartan Medoxomil (EDARBI) 40 MG TABS    Sig: Take 1 tablet by mouth daily.    Dispense:  90 tablet    Refill:  1  . Cholecalciferol 2000 units TABS    Sig: Take 2 tablets (4,000 Units total) by mouth daily.    Dispense:  180 tablet    Refill:  1     Follow-up: Return in about 3 months (around 08/23/2018).  Sanda Linger, MD

## 2018-06-29 ENCOUNTER — Other Ambulatory Visit: Payer: Self-pay | Admitting: Internal Medicine

## 2018-06-29 ENCOUNTER — Encounter: Payer: Self-pay | Admitting: Internal Medicine

## 2018-06-29 DIAGNOSIS — F900 Attention-deficit hyperactivity disorder, predominantly inattentive type: Secondary | ICD-10-CM

## 2018-06-29 MED ORDER — LISDEXAMFETAMINE DIMESYLATE 40 MG PO CAPS: 40.0000 mg | ORAL_CAPSULE | ORAL | 0 refills | Status: DC

## 2018-07-18 ENCOUNTER — Encounter: Payer: Self-pay | Admitting: Internal Medicine

## 2018-07-19 ENCOUNTER — Telehealth: Payer: Self-pay

## 2018-07-19 ENCOUNTER — Other Ambulatory Visit: Payer: Self-pay | Admitting: Internal Medicine

## 2018-07-19 ENCOUNTER — Ambulatory Visit: Payer: 59

## 2018-07-19 DIAGNOSIS — I10 Essential (primary) hypertension: Secondary | ICD-10-CM

## 2018-07-19 MED ORDER — AZILSARTAN-CHLORTHALIDONE 40-12.5 MG PO TABS: 1.0000 | ORAL_TABLET | Freq: Every day | ORAL | 0 refills | Status: DC

## 2018-07-19 NOTE — Telephone Encounter (Signed)
Have him return in 3 weeks for a BP check with me, + labs to monitor his K+ level

## 2018-07-19 NOTE — Telephone Encounter (Signed)
Patient came in for bp recheck today (nurse visit)--reading today is 130/76--currently on edarbi----has picked up samples for edarbyclor because of ankle swelling---he will begin med with diuretic as you suggested----routing to dr Yetta Barrejones, do you want patient to come back for another bp recheck, please advise, thanks---patient would like to be advised via mychart message

## 2018-07-20 NOTE — Telephone Encounter (Signed)
mychart message sent to patient with dr Yetta Barrejones instructions

## 2018-07-25 ENCOUNTER — Encounter: Payer: Self-pay | Admitting: Internal Medicine

## 2018-07-25 ENCOUNTER — Ambulatory Visit: Payer: 59 | Admitting: Internal Medicine

## 2018-07-25 ENCOUNTER — Other Ambulatory Visit (INDEPENDENT_AMBULATORY_CARE_PROVIDER_SITE_OTHER): Payer: 59

## 2018-07-25 VITALS — BP 112/62 | HR 82 | Temp 98.6°F | Ht 73.0 in | Wt 243.2 lb

## 2018-07-25 DIAGNOSIS — M791 Myalgia, unspecified site: Secondary | ICD-10-CM

## 2018-07-25 DIAGNOSIS — I10 Essential (primary) hypertension: Secondary | ICD-10-CM | POA: Diagnosis not present

## 2018-07-25 LAB — BASIC METABOLIC PANEL
BUN: 19 mg/dL (ref 6–23)
CHLORIDE: 98 meq/L (ref 96–112)
CO2: 31 meq/L (ref 19–32)
CREATININE: 1.34 mg/dL (ref 0.40–1.50)
Calcium: 9.4 mg/dL (ref 8.4–10.5)
GFR: 62.18 mL/min (ref >60.00)
GLUCOSE: 82 mg/dL (ref 70–99)
Potassium: 4.3 mEq/L (ref 3.5–5.1)
Sodium: 134 mEq/L — ABNORMAL LOW (ref 135–145)

## 2018-07-25 LAB — SEDIMENTATION RATE: Sed Rate: 8 mm/hr (ref 0–15)

## 2018-07-25 LAB — CK: Total CK: 357 U/L — ABNORMAL HIGH (ref 7–232)

## 2018-07-25 LAB — MAGNESIUM: Magnesium: 2.3 mg/dL (ref 1.5–2.5)

## 2018-07-25 NOTE — Progress Notes (Signed)
Subjective:  Patient ID: Leon Carroll, male    DOB: [birth year]  Age: 42 y.o. MRN: [id number]  CC: Hypertension   HPI Leon Carroll presents for a BP check - He recently called the office and complained of ankle edema so a thiazide diuretic was added to the ARB.  He now complains of a several day history of muscle cramping, dizziness, lightheadedness, weakness, and fatigue.  He played tennis 1 day prior to this visit.  Outpatient Medications Prior to Visit  Medication Sig Dispense Refill  . Cholecalciferol 2000 units TABS Take 2 tablets (4,000 Units total) by mouth daily. 180 tablet 1  . lisdexamfetamine (VYVANSE) 40 MG capsule Take 1 capsule (40 mg total) by mouth every morning. 40 capsule 0  . Azilsartan-Chlorthalidone (EDARBYCLOR) 40-12.5 MG TABS Take 1 tablet by mouth daily. 90 tablet 0   No facility-administered medications prior to visit.     ROS Review of Systems  Constitutional: Positive for fatigue. Negative for diaphoresis.  HENT: Negative.   Eyes: Negative for visual disturbance.  Respiratory: Negative for cough, chest tightness, shortness of breath and wheezing.   Cardiovascular: Negative for chest pain, palpitations and leg swelling.  Gastrointestinal: Negative for abdominal pain, constipation, diarrhea, nausea and vomiting.  Endocrine: Negative.   Genitourinary: Negative.  Negative for difficulty urinating and frequency.  Musculoskeletal: Positive for myalgias. Negative for arthralgias.  Skin: Negative.  Negative for pallor and rash.  Neurological: Positive for dizziness, weakness and light-headedness. Negative for headaches.  Hematological: Negative for adenopathy. Does not bruise/bleed easily.  Psychiatric/Behavioral: Negative.     Objective:  BP 112/62 (BP Location: Left Arm, Patient Position: Sitting, Cuff Size: Large)   Pulse 82   Temp 98.6 F (37 C) (Oral)   Ht 6\' 1"  (1.854 m)   Wt 243 lb 4 oz (110.3 kg)   SpO2 96%   BMI 32.09 kg/m   BP  Readings from Last 3 Encounters:  07/25/18 112/62  05/24/18 140/88  11/02/17 140/80    Wt Readings from Last 3 Encounters:  07/25/18 243 lb 4 oz (110.3 kg)  05/24/18 246 lb (111.6 kg)  11/02/17 245 lb (111.1 kg)    Physical Exam  Constitutional: He is oriented to person, place, and time. No distress.  HENT:  Mouth/Throat: Oropharynx is clear and moist. No oropharyngeal exudate.  Eyes: Conjunctivae are normal. No scleral icterus.  Neck: Normal range of motion. Neck supple. No JVD present. No thyromegaly present.  Cardiovascular: Normal rate, regular rhythm and normal heart sounds.  No murmur heard. Pulmonary/Chest: Effort normal and breath sounds normal. No respiratory distress. He has no wheezes. He has no rales.  Abdominal: Soft. Bowel sounds are normal. He exhibits no mass. There is no hepatosplenomegaly. There is no tenderness.  Musculoskeletal: Normal range of motion. He exhibits no edema, tenderness or deformity.  Lymphadenopathy:    He has no cervical adenopathy.  Neurological: He is alert and oriented to person, place, and time.  Skin: Skin is warm and dry. No rash noted. He is not diaphoretic.  Vitals reviewed.   Lab Results  Component Value Date   WBC 6.4 05/24/2018   HGB 16.1 05/24/2018   HCT 46.8 05/24/2018   PLT 250.0 05/24/2018   GLUCOSE 82 07/25/2018   CHOL 203 (H) 05/24/2018   TRIG 117.0 05/24/2018   HDL 37.80 (L) 05/24/2018   LDLDIRECT 173.0 08/02/2013   LDLCALC 142 (H) 05/24/2018   ALT 21 05/24/2018   AST 20 05/24/2018   NA 134 (L)  07/25/2018   K 4.3 07/25/2018   CL 98 07/25/2018   CREATININE 1.34 07/25/2018   BUN 19 07/25/2018   CO2 31 07/25/2018   TSH 1.67 05/24/2018    Dg Chest 2 View  Result Date: 09/03/2016 CLINICAL DATA:  Shortness of breath and chest tightness for 4 days. EXAM: CHEST  2 VIEW COMPARISON:  PA and lateral chest 07/01/2014. FINDINGS: The lungs are clear. Heart size is normal. No pneumothorax or pleural fluid. IMPRESSION:  Negative chest. Electronically Signed   By: Drusilla Kanner M.D.   On: 09/03/2016 16:22    Assessment & Plan:   Dayvin was seen today for hypertension.  Diagnoses and all orders for this visit:  Essential hypertension, benign- His blood pressure is over controlled and he has mild hyponatremia.  I have therefore asked him to stop taking the thiazide diuretic and will control his blood pressure with the ARB. -     Basic metabolic panel; Future -     Magnesium; Future -     Azilsartan Medoxomil (EDARBI) 40 MG TABS; Take 1 tablet by mouth daily.  Myalgia- He has a mildly elevated CPK level but his sed rate is normal.  This is consistent with recent physical activity.  I have encouraged him to stay well-hydrated to treat this. -     CK; Future -     Magnesium; Future -     Sedimentation rate; Future   I have discontinued [name]. Dohner's Azilsartan-Chlorthalidone. I am also having him start on Azilsartan Medoxomil. Additionally, I am having him maintain his Cholecalciferol and lisdexamfetamine.  Meds ordered this encounter  Medications  . Azilsartan Medoxomil (EDARBI) 40 MG TABS    Sig: Take 1 tablet by mouth daily.    Dispense:  90 tablet    Refill:  1     Follow-up: Return in about 4 weeks (around 08/22/2018).  Sanda Linger, MD

## 2018-07-25 NOTE — Patient Instructions (Signed)

## 2018-07-26 ENCOUNTER — Encounter: Payer: Self-pay | Admitting: Internal Medicine

## 2018-07-26 MED ORDER — AZILSARTAN MEDOXOMIL 40 MG PO TABS: 1.0000 | ORAL_TABLET | Freq: Every day | ORAL | 1 refills | Status: DC

## 2018-08-03 ENCOUNTER — Encounter: Payer: Self-pay | Admitting: Internal Medicine

## 2018-08-08 ENCOUNTER — Other Ambulatory Visit: Payer: Self-pay | Admitting: Internal Medicine

## 2018-08-08 DIAGNOSIS — R600 Localized edema: Secondary | ICD-10-CM

## 2018-08-08 DIAGNOSIS — I10 Essential (primary) hypertension: Secondary | ICD-10-CM

## 2018-08-08 MED ORDER — TORSEMIDE 5 MG PO TABS: 5.0000 mg | ORAL_TABLET | Freq: Every day | ORAL | 0 refills | Status: DC

## 2018-10-30 ENCOUNTER — Other Ambulatory Visit: Payer: Self-pay | Admitting: Internal Medicine

## 2018-10-30 DIAGNOSIS — R600 Localized edema: Secondary | ICD-10-CM

## 2018-10-30 DIAGNOSIS — I10 Essential (primary) hypertension: Secondary | ICD-10-CM

## 2018-11-01 ENCOUNTER — Encounter: Payer: Self-pay | Admitting: Internal Medicine

## 2018-11-02 ENCOUNTER — Other Ambulatory Visit: Payer: Self-pay | Admitting: Internal Medicine

## 2018-11-02 DIAGNOSIS — B001 Herpesviral vesicular dermatitis: Secondary | ICD-10-CM

## 2018-11-02 MED ORDER — VALACYCLOVIR HCL 1 G PO TABS: 1000.0000 mg | ORAL_TABLET | Freq: Two times a day (BID) | ORAL | 2 refills | Status: DC

## 2019-01-29 ENCOUNTER — Other Ambulatory Visit: Payer: Self-pay | Admitting: Internal Medicine

## 2019-01-29 ENCOUNTER — Encounter: Payer: Self-pay | Admitting: Internal Medicine

## 2019-01-29 DIAGNOSIS — F900 Attention-deficit hyperactivity disorder, predominantly inattentive type: Secondary | ICD-10-CM

## 2019-01-29 MED ORDER — LISDEXAMFETAMINE DIMESYLATE 30 MG PO CAPS: 30.0000 mg | ORAL_CAPSULE | Freq: Every day | ORAL | 0 refills | Status: DC

## 2019-03-07 ENCOUNTER — Encounter: Payer: Self-pay | Admitting: Internal Medicine

## 2019-03-07 ENCOUNTER — Other Ambulatory Visit: Payer: Self-pay | Admitting: Internal Medicine

## 2019-03-07 DIAGNOSIS — F5102 Adjustment insomnia: Secondary | ICD-10-CM

## 2019-03-07 DIAGNOSIS — F900 Attention-deficit hyperactivity disorder, predominantly inattentive type: Secondary | ICD-10-CM

## 2019-03-07 MED ORDER — LISDEXAMFETAMINE DIMESYLATE 30 MG PO CAPS: 30.0000 mg | ORAL_CAPSULE | Freq: Every day | ORAL | 0 refills | Status: DC

## 2019-03-07 MED ORDER — ESZOPICLONE 3 MG PO TABS: 3.0000 mg | ORAL_TABLET | Freq: Every day | ORAL | 3 refills | Status: DC

## 2019-04-02 ENCOUNTER — Ambulatory Visit (INDEPENDENT_AMBULATORY_CARE_PROVIDER_SITE_OTHER): Payer: 59 | Admitting: Internal Medicine

## 2019-04-02 ENCOUNTER — Encounter: Payer: Self-pay | Admitting: Internal Medicine

## 2019-04-02 ENCOUNTER — Other Ambulatory Visit: Payer: Self-pay

## 2019-04-02 VITALS — BP 134/86 | HR 76 | Temp 98.4°F | Resp 16 | Ht 73.0 in | Wt 245.0 lb

## 2019-04-02 DIAGNOSIS — H60332 Swimmer's ear, left ear: Secondary | ICD-10-CM

## 2019-04-02 DIAGNOSIS — I10 Essential (primary) hypertension: Secondary | ICD-10-CM | POA: Diagnosis not present

## 2019-04-02 MED ORDER — CIPRODEX 0.3-0.1 % OT SUSP: 4.0000 [drp] | Freq: Two times a day (BID) | OTIC | 0 refills | Status: DC

## 2019-04-02 MED ORDER — OFLOXACIN 300 MG PO TABS: 300.0000 mg | ORAL_TABLET | Freq: Two times a day (BID) | ORAL | 0 refills | Status: DC

## 2019-04-02 MED ORDER — IRBESARTAN 150 MG PO TABS: 150.0000 mg | ORAL_TABLET | Freq: Every day | ORAL | 1 refills | Status: DC

## 2019-04-02 NOTE — Patient Instructions (Signed)
Otitis Externa  Otitis externa is an infection of the outer ear canal. The outer ear canal is the area between the outside of the ear and the eardrum. Otitis externa is sometimes called swimmer's ear. What are the causes? Common causes of this condition include:  Swimming in dirty water.  Moisture in the ear.  An injury to the inside of the ear.  An object stuck in the ear.  A cut or scrape on the outside of the ear. What increases the risk? You are more likely to develop this condition if you go swimming often. What are the signs or symptoms? The first symptom of this condition is often itching in the ear. Later symptoms of the condition include:  Swelling of the ear.  Redness in the ear.  Ear pain. The pain may get worse when you pull on your ear.  Pus coming from the ear. How is this diagnosed? This condition may be diagnosed by examining the ear and testing fluid from the ear for bacteria and funguses. How is this treated? This condition may be treated with:  Antibiotic ear drops. These are often given for 10-14 days.  Medicines to reduce itching and swelling. Follow these instructions at home:  If you were prescribed antibiotic ear drops, use them as told by your health care provider. Do not stop using the antibiotic even if your condition improves.  Take over-the-counter and prescription medicines only as told by your health care provider.  Avoid getting water in your ears as told by your health care provider. This may include avoiding swimming or water sports for a few days.  Keep all follow-up visits as told by your health care provider. This is important. How is this prevented?  Keep your ears dry. Use the corner of a towel to dry your ears after you swim or bathe.  Avoid scratching or putting things in your ear. Doing these things can damage the ear canal or remove the protective wax that lines it, which makes it easier for bacteria and funguses to grow.   Avoid swimming in lakes, polluted water, or pools that may not have enough chlorine. Contact a health care provider if:  You have a fever.  Your ear is still red, swollen, painful, or draining pus after 3 days.  Your redness, swelling, or pain gets worse.  You have a severe headache.  You have redness, swelling, pain, or tenderness in the area behind your ear. Summary  Otitis externa is an infection of the outer ear canal.  Common causes include swimming in dirty water, moisture in the ear, or a cut or scrape in the ear.  Symptoms include pain, redness, and swelling of the ear.  If you were prescribed antibiotic ear drops, use them as told by your health care provider. Do not stop using the antibiotic even if your condition improves. This information is not intended to replace advice given to you by your health care provider. Make sure you discuss any questions you have with your health care provider. Document Released: 08/16/2005 Document Revised: 01/20/2018 Document Reviewed: 01/20/2018 Elsevier Patient Education  2020 [employer]  

## 2019-04-03 MED ORDER — CIPROFLOXACIN HCL 500 MG PO TABS: 500.0000 mg | ORAL_TABLET | Freq: Two times a day (BID) | ORAL | 0 refills | Status: AC

## 2019-04-03 MED ORDER — NEOMYCIN-POLYMYXIN-HC 1 % OT SOLN: 3.0000 [drp] | Freq: Four times a day (QID) | OTIC | 1 refills | Status: DC

## 2019-04-03 MED ORDER — CIPROFLOXACIN-FLUOCINOLONE PF 0.3-0.025 % OT SOLN: 0.2500 mL | Freq: Two times a day (BID) | OTIC | 0 refills | Status: DC

## 2019-04-03 NOTE — Progress Notes (Signed)
Subjective:  Patient ID: Leon LibelJames Stacey Carroll, male    DOB: [birth year]  Age: 43 y.o. MRN: [id number]  CC: Hypertension and Otalgia   HPI Leon Carroll presents for a 2-day history of earache worse on the left than the right.  He denies dizziness, vertigo, or loss of hearing.  He is concerned that his blood pressure is not adequately well controlled.  He is not taking [place] because it was too expensive and he is not taking torsemide because it caused side effects.  He is active and denies any recent episodes of headache, shortness of breath, chest pain, edema, or fatigue.  Outpatient Medications Prior to Visit  Medication Sig Dispense Refill  . Eszopiclone (ESZOPICLONE) 3 MG TABS Take 1 tablet (3 mg total) by mouth at bedtime. Take immediately before bedtime 30 tablet 3  . lisdexamfetamine (VYVANSE) 30 MG capsule Take 1 capsule (30 mg total) by mouth daily. 90 capsule 0  . Azilsartan Medoxomil (EDARBI) 40 MG TABS Take 1 tablet by mouth daily. (Patient not taking: Reported on 04/02/2019) 90 tablet 1  . Cholecalciferol 2000 units TABS Take 2 tablets (4,000 Units total) by mouth daily. (Patient not taking: Reported on 04/02/2019) 180 tablet 1  . torsemide (DEMADEX) 5 MG tablet TAKE 1 TABLET BY MOUTH EVERY DAY (Patient not taking: Reported on 04/02/2019) 90 tablet 0   No facility-administered medications prior to visit.     ROS Review of Systems  Constitutional: Negative for chills, diaphoresis, fatigue and fever.  HENT: Positive for ear pain. Negative for ear discharge, hearing loss, sinus pressure, sore throat and trouble swallowing.   Eyes: Negative.   Respiratory: Negative for cough, chest tightness, shortness of breath and wheezing.   Cardiovascular: Negative for chest pain, palpitations and leg swelling.  Gastrointestinal: Negative for abdominal pain, constipation, diarrhea, nausea and vomiting.  Endocrine: Negative.   Genitourinary: Negative.  Negative for difficulty urinating.   Musculoskeletal: Negative for arthralgias and myalgias.  Skin: Negative.  Negative for color change and rash.  Neurological: Negative for dizziness, weakness, light-headedness and headaches.  Hematological: Negative for adenopathy.  Psychiatric/Behavioral: Negative.     Objective:  BP 134/86 (BP Location: Left Arm, Patient Position: Sitting, Cuff Size: Large)   Pulse 76   Temp 98.4 F (36.9 C) (Oral)   Resp 16   Ht 6\' 1"  (1.854 m)   Wt 245 lb (111.1 kg)   SpO2 98%   BMI 32.32 kg/m   BP Readings from Last 3 Encounters:  04/02/19 134/86  07/25/18 112/62  05/24/18 140/88    Wt Readings from Last 3 Encounters:  04/02/19 245 lb (111.1 kg)  07/25/18 243 lb 4 oz (110.3 kg)  05/24/18 246 lb (111.6 kg)    Physical Exam Vitals signs reviewed.  Constitutional:      General: He is not in acute distress.    Appearance: He is obese. He is not ill-appearing, toxic-appearing or diaphoretic.  HENT:     Right Ear: Hearing normal. No middle ear effusion. There is no impacted cerumen. No foreign body. No mastoid tenderness. Tympanic membrane is not injected, erythematous or retracted.     Left Ear: Hearing normal. Tenderness present. No foreign body. No mastoid tenderness.     Ears:     Comments: Left pinna pull test is positive.  Left EAC is swollen and I cannot see the TM.  There is no cerumen or foreign body.  The right EAC is mildly swollen.    Nose: Nose normal. No congestion  or rhinorrhea.     Mouth/Throat:     Mouth: Mucous membranes are moist.     Pharynx: No oropharyngeal exudate or posterior oropharyngeal erythema.  Eyes:     General: No scleral icterus.    Conjunctiva/sclera: Conjunctivae normal.  Neck:     Musculoskeletal: No muscular tenderness.  Cardiovascular:     Rate and Rhythm: Normal rate and regular rhythm.     Heart sounds: No murmur.  Pulmonary:     Effort: Pulmonary effort is normal.     Breath sounds: No stridor. No wheezing, rhonchi or rales.  Abdominal:      General: Abdomen is flat. Bowel sounds are normal. There is no distension.     Palpations: There is no hepatomegaly or splenomegaly.     Tenderness: There is no abdominal tenderness.  Musculoskeletal: Normal range of motion.     Right lower leg: No edema.     Left lower leg: No edema.  Lymphadenopathy:     Head:     Right side of head: No submental, submandibular, tonsillar, preauricular, posterior auricular or occipital adenopathy.     Left side of head: No submental, submandibular, tonsillar, preauricular, posterior auricular or occipital adenopathy.     Cervical: No cervical adenopathy.     Right cervical: No superficial, deep or posterior cervical adenopathy.    Left cervical: No deep or posterior cervical adenopathy.  Skin:    General: Skin is warm and dry.     Findings: No rash.  Neurological:     General: No focal deficit present.     Mental Status: He is alert.  Psychiatric:        Mood and Affect: Mood normal.        Behavior: Behavior normal.     Lab Results  Component Value Date   WBC 6.4 05/24/2018   HGB 16.1 05/24/2018   HCT 46.8 05/24/2018   PLT 250.0 05/24/2018   GLUCOSE 82 07/25/2018   CHOL 203 (H) 05/24/2018   TRIG 117.0 05/24/2018   HDL 37.80 (L) 05/24/2018   LDLDIRECT 173.0 08/02/2013   LDLCALC 142 (H) 05/24/2018   ALT 21 05/24/2018   AST 20 05/24/2018   NA 134 (L) 07/25/2018   K 4.3 07/25/2018   CL 98 07/25/2018   CREATININE 1.34 07/25/2018   BUN 19 07/25/2018   CO2 31 07/25/2018   TSH 1.67 05/24/2018    Dg Chest 2 View  Result Date: 09/03/2016 CLINICAL DATA:  Shortness of breath and chest tightness for 4 days. EXAM: CHEST  2 VIEW COMPARISON:  PA and lateral chest 07/01/2014. FINDINGS: The lungs are clear. Heart size is normal. No pneumothorax or pleural fluid. IMPRESSION: Negative chest. Electronically Signed   By: Inge Rise M.D.   On: 09/03/2016 16:22    Assessment & Plan:   Leon Carroll was seen today for hypertension and otalgia.   Diagnoses and all orders for this visit:  Acute swimmer's ear of left side- I recommended treating this with a systemic fluoroquinolone and a topical fluoroquinolone/steroid combination.  He does not want anything for pain relief. -     ofloxacin (FLOXIN) 300 MG tablet; Take 1 tablet (300 mg total) by mouth 2 (two) times daily for 7 days. -     ciprofloxacin-dexamethasone (CIPRODEX) OTIC suspension; Place 4 drops into both ears 2 (two) times daily. -     ciprofloxacin-fluocinolone PF (OTOVEL) 0.3-0.025 % SOLN; Place 0.25 mLs into both ears 2 (two) times daily.  Essential hypertension, benign- His  blood pressure is not adequately well controlled.  I have asked him to start taking irbesartan. -     irbesartan (AVAPRO) 150 MG tablet; Take 1 tablet (150 mg total) by mouth daily.   I have discontinued Adelene AmasJames S. Lantry's Cholecalciferol, Azilsartan Medoxomil, torsemide, ofloxacin, Ciprodex, and ciprofloxacin-fluocinolone PF. I am also having him start on irbesartan, ciprofloxacin, and NEOMYCIN-POLYMYXIN-HYDROCORTISONE. Additionally, I am having him maintain his lisdexamfetamine and Eszopiclone.  Meds ordered this encounter  Medications  . DISCONTD: ofloxacin (FLOXIN) 300 MG tablet    Sig: Take 1 tablet (300 mg total) by mouth 2 (two) times daily for 7 days.    Dispense:  14 tablet    Refill:  0  . DISCONTD: ciprofloxacin-dexamethasone (CIPRODEX) OTIC suspension    Sig: Place 4 drops into both ears 2 (two) times daily.    Dispense:  15 mL    Refill:  0  . irbesartan (AVAPRO) 150 MG tablet    Sig: Take 1 tablet (150 mg total) by mouth daily.    Dispense:  90 tablet    Refill:  1  . DISCONTD: ciprofloxacin-fluocinolone PF (OTOVEL) 0.3-0.025 % SOLN    Sig: Place 0.25 mLs into both ears 2 (two) times daily.    Dispense:  28 each    Refill:  0  . ciprofloxacin (CIPRO) 500 MG tablet    Sig: Take 1 tablet (500 mg total) by mouth 2 (two) times daily for 10 days.    Dispense:  20 tablet    Refill:   0  . NEOMYCIN-POLYMYXIN-HYDROCORTISONE (CORTISPORIN) 1 % SOLN OTIC solution    Sig: Place 3 drops into both ears every 6 (six) hours.    Dispense:  10 mL    Refill:  1     Follow-up: Return in about 1 week (around 04/09/2019).  Sanda Lingerhomas , MD

## 2019-04-24 ENCOUNTER — Encounter: Payer: Self-pay | Admitting: Internal Medicine

## 2019-04-24 ENCOUNTER — Other Ambulatory Visit: Payer: Self-pay | Admitting: Internal Medicine

## 2019-04-24 DIAGNOSIS — F900 Attention-deficit hyperactivity disorder, predominantly inattentive type: Secondary | ICD-10-CM

## 2019-04-24 MED ORDER — LISDEXAMFETAMINE DIMESYLATE 30 MG PO CAPS: 30.0000 mg | ORAL_CAPSULE | Freq: Every day | ORAL | 0 refills | Status: DC

## 2019-07-12 ENCOUNTER — Encounter: Payer: Self-pay | Admitting: Internal Medicine

## 2019-07-12 ENCOUNTER — Other Ambulatory Visit: Payer: Self-pay | Admitting: Internal Medicine

## 2019-07-12 DIAGNOSIS — N522 Drug-induced erectile dysfunction: Secondary | ICD-10-CM | POA: Insufficient documentation

## 2019-07-12 DIAGNOSIS — F5102 Adjustment insomnia: Secondary | ICD-10-CM

## 2019-07-12 MED ORDER — ESZOPICLONE 3 MG PO TABS: 3.0000 mg | ORAL_TABLET | Freq: Every day | ORAL | 3 refills | Status: DC

## 2019-07-12 MED ORDER — SILDENAFIL CITRATE 20 MG PO TABS: 80.0000 mg | ORAL_TABLET | Freq: Every day | ORAL | 11 refills | Status: DC | PRN

## 2019-09-12 ENCOUNTER — Other Ambulatory Visit: Payer: Self-pay | Admitting: Internal Medicine

## 2019-09-12 ENCOUNTER — Encounter: Payer: Self-pay | Admitting: Internal Medicine

## 2019-09-12 DIAGNOSIS — B001 Herpesviral vesicular dermatitis: Secondary | ICD-10-CM

## 2019-09-12 MED ORDER — VALACYCLOVIR HCL 1 G PO TABS: 1000.0000 mg | ORAL_TABLET | Freq: Two times a day (BID) | ORAL | 2 refills | Status: DC

## 2019-09-27 ENCOUNTER — Other Ambulatory Visit: Payer: Self-pay | Admitting: Internal Medicine

## 2019-09-27 DIAGNOSIS — I10 Essential (primary) hypertension: Secondary | ICD-10-CM

## 2019-09-28 ENCOUNTER — Other Ambulatory Visit: Payer: Self-pay | Admitting: Internal Medicine

## 2019-09-28 DIAGNOSIS — B001 Herpesviral vesicular dermatitis: Secondary | ICD-10-CM

## 2019-09-28 MED ORDER — VALACYCLOVIR HCL 1 G PO TABS: 1000.0000 mg | ORAL_TABLET | Freq: Two times a day (BID) | ORAL | 2 refills | Status: AC

## 2019-11-14 ENCOUNTER — Encounter: Payer: Self-pay | Admitting: Internal Medicine

## 2019-11-22 ENCOUNTER — Encounter: Payer: Self-pay | Admitting: Internal Medicine

## 2019-11-23 ENCOUNTER — Ambulatory Visit: Payer: 59 | Attending: Internal Medicine

## 2019-11-23 ENCOUNTER — Other Ambulatory Visit: Payer: Self-pay | Admitting: Internal Medicine

## 2019-11-23 DIAGNOSIS — H66003 Acute suppurative otitis media without spontaneous rupture of ear drum, bilateral: Secondary | ICD-10-CM

## 2019-11-23 MED ORDER — AMOXICILLIN-POT CLAVULANATE 875-125 MG PO TABS: 1.0000 | ORAL_TABLET | Freq: Two times a day (BID) | ORAL | 0 refills | Status: AC

## 2019-12-31 ENCOUNTER — Other Ambulatory Visit: Payer: Self-pay | Admitting: Internal Medicine

## 2019-12-31 DIAGNOSIS — I10 Essential (primary) hypertension: Secondary | ICD-10-CM

## 2020-01-29 ENCOUNTER — Encounter: Payer: Self-pay | Admitting: Internal Medicine

## 2020-01-29 ENCOUNTER — Ambulatory Visit: Payer: 59 | Admitting: Internal Medicine

## 2020-01-29 ENCOUNTER — Other Ambulatory Visit: Payer: Self-pay

## 2020-01-29 VITALS — BP 124/74 | HR 83 | Temp 98.1°F | Resp 16 | Ht 73.0 in | Wt 231.0 lb

## 2020-01-29 DIAGNOSIS — E559 Vitamin D deficiency, unspecified: Secondary | ICD-10-CM

## 2020-01-29 DIAGNOSIS — Z0001 Encounter for general adult medical examination with abnormal findings: Secondary | ICD-10-CM | POA: Diagnosis not present

## 2020-01-29 DIAGNOSIS — E291 Testicular hypofunction: Secondary | ICD-10-CM

## 2020-01-29 DIAGNOSIS — Z125 Encounter for screening for malignant neoplasm of prostate: Secondary | ICD-10-CM | POA: Diagnosis not present

## 2020-01-29 DIAGNOSIS — I10 Essential (primary) hypertension: Secondary | ICD-10-CM

## 2020-01-29 DIAGNOSIS — E785 Hyperlipidemia, unspecified: Secondary | ICD-10-CM

## 2020-01-29 DIAGNOSIS — Z0000 Encounter for general adult medical examination without abnormal findings: Secondary | ICD-10-CM

## 2020-01-29 DIAGNOSIS — N522 Drug-induced erectile dysfunction: Secondary | ICD-10-CM | POA: Diagnosis not present

## 2020-01-29 MED ORDER — TADALAFIL 20 MG PO TABS: 20.0000 mg | ORAL_TABLET | Freq: Every day | ORAL | 5 refills | Status: DC | PRN

## 2020-01-29 NOTE — Patient Instructions (Signed)

## 2020-01-29 NOTE — Progress Notes (Signed)
Subjective:  Patient ID: Leon Carroll, male    DOB: October 15, 1975  Age: 44 y.o. MRN: [id number]  CC: Annual Exam and Hypertension  This visit occurred during the SARS-CoV-2 public health emergency.  Safety protocols were in place, including screening questions prior to the visit, additional usage of staff PPE, and extensive cleaning of exam room while observing appropriate contact time as indicated for disinfecting solutions.    HPI Leon Carroll presents for a CPX.  He complains of erectile dysfunction with difficulty maintaining an erection.  He has had minimal improvement with 80 mg of sildenafil.  He tells me his blood pressure has been well controlled.  He denies any recent episodes of headache, blurred vision, chest pain, shortness of breath, palpitations, edema, edema, or fatigue.  Outpatient Medications Prior to Visit  Medication Sig Dispense Refill  . irbesartan (AVAPRO) 150 MG tablet TAKE 1 TABLET BY MOUTH EVERY DAY 90 tablet 0  . valACYclovir (VALTREX) 1000 MG tablet Take 1,000 mg by mouth 2 (two) times daily.    . sildenafil (REVATIO) 20 MG tablet Take 4 tablets (80 mg total) by mouth daily as needed. 60 tablet 11  . Eszopiclone (ESZOPICLONE) 3 MG TABS Take 1 tablet (3 mg total) by mouth at bedtime. Take immediately before bedtime 30 tablet 3  . lisdexamfetamine (VYVANSE) 30 MG capsule Take 1 capsule (30 mg total) by mouth daily. (Patient not taking: Reported on 01/29/2020) 30 capsule 0   No facility-administered medications prior to visit.    ROS Review of Systems  Constitutional: Negative.  Negative for appetite change, diaphoresis, fatigue and unexpected weight change.  HENT: Negative.   Eyes: Negative for visual disturbance.  Respiratory: Negative for cough, chest tightness, shortness of breath and wheezing.   Cardiovascular: Negative for chest pain, palpitations and leg swelling.  Gastrointestinal: Negative for abdominal pain, constipation, diarrhea, nausea  and vomiting.  Endocrine: Negative.   Genitourinary: Negative.  Negative for difficulty urinating, scrotal swelling, testicular pain and urgency.  Musculoskeletal: Negative for arthralgias and myalgias.  Skin: Negative.  Negative for color change and pallor.  Neurological: Negative.  Negative for dizziness, weakness, light-headedness, numbness and headaches.  Hematological: Negative for adenopathy. Does not bruise/bleed easily.  Psychiatric/Behavioral: Negative.     Objective:  BP 124/74 (BP Location: Left Arm, Patient Position: Sitting, Cuff Size: Large)   Pulse 83   Temp 98.1 F (36.7 C) (Oral)   Resp 16   Ht 6\' 1"  (1.854 m)   Wt 231 lb (104.8 kg)   SpO2 96%   BMI 30.48 kg/m   BP Readings from Last 3 Encounters:  01/29/20 124/74  04/02/19 134/86  07/25/18 112/62    Wt Readings from Last 3 Encounters:  01/29/20 231 lb (104.8 kg)  04/02/19 245 lb (111.1 kg)  07/25/18 243 lb 4 oz (110.3 kg)    Physical Exam Vitals reviewed.  Constitutional:      Appearance: Normal appearance.  HENT:     Nose: Nose normal.     Mouth/Throat:     Mouth: Mucous membranes are moist.  Eyes:     General: No scleral icterus.    Conjunctiva/sclera: Conjunctivae normal.  Cardiovascular:     Rate and Rhythm: Normal rate and regular rhythm.     Heart sounds: No murmur.  Pulmonary:     Breath sounds: No stridor. No wheezing, rhonchi or rales.  Abdominal:     General: Abdomen is flat. Bowel sounds are normal. There is no distension.  Palpations: Abdomen is soft. There is no hepatomegaly, splenomegaly or mass.     Tenderness: There is no abdominal tenderness.  Musculoskeletal:        General: Normal range of motion.     Cervical back: Neck supple.     Right lower leg: No edema.     Left lower leg: No edema.  Lymphadenopathy:     Cervical: No cervical adenopathy.  Skin:    General: Skin is warm and dry.     Coloration: Skin is not pale.  Neurological:     General: No focal deficit  present.     Mental Status: He is alert.  Psychiatric:        Mood and Affect: Mood normal.        Behavior: Behavior normal.     Lab Results  Component Value Date   WBC 4.9 01/29/2020   HGB 14.1 01/29/2020   HCT 41.4 01/29/2020   PLT 241.0 01/29/2020   GLUCOSE 76 01/29/2020   CHOL 192 01/29/2020   TRIG 173.0 (H) 01/29/2020   HDL 38.70 (L) 01/29/2020   LDLDIRECT 173.0 08/02/2013   LDLCALC 118 (H) 01/29/2020   ALT 14 01/29/2020   AST 17 01/29/2020   NA 137 01/29/2020   K 4.6 01/29/2020   CL 103 01/29/2020   CREATININE 1.16 01/29/2020   BUN 12 01/29/2020   CO2 31 01/29/2020   TSH 1.83 01/29/2020   PSA 0.35 01/29/2020    DG Chest 2 View  Result Date: 09/03/2016 CLINICAL DATA:  Shortness of breath and chest tightness for 4 days. EXAM: CHEST  2 VIEW COMPARISON:  PA and lateral chest 07/01/2014. FINDINGS: The lungs are clear. Heart size is normal. No pneumothorax or pleural fluid. IMPRESSION: Negative chest. Electronically Signed   By: Inge Rise M.D.   On: 09/03/2016 16:22    Assessment & Plan:   Leon Carroll was seen today for annual exam and hypertension.  Diagnoses and all orders for this visit:  Hypogonadism male- His total testosterone level is borderline low at 289 with a slightly low free and bioavailable testosterone.  I think he would benefit from taking clomiphene. -     TSH; Future -     Hepatic function panel; Future -     Testosterone Total,Free,Bio, Males; Future -     Testosterone Total,Free,Bio, Males -     Hepatic function panel -     TSH -     clomiPHENE (CLOMID) 50 MG tablet; Take 1 tablet (50 mg total) by mouth daily.  Essential hypertension, benign- His BP is well controlled. -     CBC with Differential/Platelet; Future -     Basic metabolic panel; Future -     TSH; Future -     Hepatic function panel; Future -     Hepatic function panel -     TSH -     Basic metabolic panel -     CBC with Differential/Platelet  Vitamin D deficiency- His  vitamin D level is normal now. -     VITAMIN D 25 Hydroxy (Vit-D Deficiency, Fractures); Future -     VITAMIN D 25 Hydroxy (Vit-D Deficiency, Fractures)  Routine general medical examination at a health care facility-Exam completed, labs reviewed, vaccines reviewed and updated, patient education was given. -     Lipid panel; Future -     PSA; Future -     PSA -     Lipid panel  Drug-induced erectile dysfunction- I will  treat the mild hypogonadism and I recommended that he try tadalafil. -     Discontinue: tadalafil (CIALIS) 20 MG tablet; Take 1 tablet (20 mg total) by mouth daily as needed for erectile dysfunction. -     Discontinue: tadalafil (CIALIS) 20 MG tablet; Take 1 tablet (20 mg total) by mouth daily as needed for erectile dysfunction. -     tadalafil (CIALIS) 20 MG tablet; Take 1 tablet (20 mg total) by mouth daily as needed for erectile dysfunction.  Hyperlipidemia with target LDL less than 130- He does not have an elevated ASCVD risk score so I did not recommend a statin for CV risk reduction.   I have discontinued [name]. [facility] lisdexamfetamine, Eszopiclone, and sildenafil. I am also having him start on clomiPHENE. Additionally, I am having him maintain his irbesartan, valACYclovir, and tadalafil.  Meds ordered this encounter  Medications  . DISCONTD: tadalafil (CIALIS) 20 MG tablet    Sig: Take 1 tablet (20 mg total) by mouth daily as needed for erectile dysfunction.    Dispense:  10 tablet    Refill:  5  . DISCONTD: tadalafil (CIALIS) 20 MG tablet    Sig: Take 1 tablet (20 mg total) by mouth daily as needed for erectile dysfunction.    Dispense:  10 tablet    Refill:  5  . tadalafil (CIALIS) 20 MG tablet    Sig: Take 1 tablet (20 mg total) by mouth daily as needed for erectile dysfunction.    Dispense:  10 tablet    Refill:  5  . clomiPHENE (CLOMID) 50 MG tablet    Sig: Take 1 tablet (50 mg total) by mouth daily.    Dispense:  90 tablet    Refill:  0   In  addition to time spent on CPE, I spent 50 minutes in preparing to see the patient by review of recent labs, imaging and procedures, obtaining and reviewing separately obtained history, communicating with the patient and family or caregiver, ordering medications, tests or procedures, and documenting clinical information in the EHR including the differential Dx, treatment, and any further evaluation and other management of 1. Hypogonadism male 2. Essential hypertension, benign 3. Vitamin D deficiency 4.Drug-induced erectile dysfunction 5. Hyperlipidemia with target LDL less than 130     Follow-up: Return in about 6 months (around 07/30/2020).  Sanda Linger, MD

## 2020-01-30 ENCOUNTER — Encounter: Payer: Self-pay | Admitting: Internal Medicine

## 2020-01-30 LAB — TESTOSTERONE TOTAL,FREE,BIO, MALES
Albumin: 4.1 g/dL (ref 3.6–5.1)
Sex Hormone Binding: 27 nmol/L (ref 10–50)
Testosterone, Bioavailable: 82.8 ng/dL — ABNORMAL LOW (ref >=110.0)
Testosterone, Free: 44 pg/mL — ABNORMAL LOW (ref 46.0–224.0)
Testosterone: 289 ng/dL (ref 250–827)

## 2020-01-30 LAB — CBC WITH DIFFERENTIAL/PLATELET
Basophils Absolute: 0 10*3/uL (ref 0.0–0.1)
Basophils Relative: 0.7 % (ref 0.0–3.0)
Eosinophils Absolute: 0.3 10*3/uL (ref 0.0–0.7)
Eosinophils Relative: 5.2 % — ABNORMAL HIGH (ref 0.0–5.0)
HCT: 41.4 % (ref 39.0–52.0)
Hemoglobin: 14.1 g/dL (ref 13.0–17.0)
Lymphocytes Relative: 34.1 % (ref 12.0–46.0)
Lymphs Abs: 1.7 10*3/uL (ref 0.7–4.0)
MCHC: 34.1 g/dL (ref 30.0–36.0)
MCV: 93.6 fl (ref 78.0–100.0)
Monocytes Absolute: 0.4 10*3/uL (ref 0.1–1.0)
Monocytes Relative: 8.3 % (ref 3.0–12.0)
Neutro Abs: 2.5 10*3/uL (ref 1.4–7.7)
Neutrophils Relative %: 51.7 % (ref 43.0–77.0)
Platelets: 241 10*3/uL (ref 150.0–400.0)
RBC: 4.42 Mil/uL (ref 4.22–5.81)
RDW: 12.6 % (ref 11.5–15.5)
WBC: 4.9 10*3/uL (ref 4.0–10.5)

## 2020-01-30 LAB — HEPATIC FUNCTION PANEL
ALT: 14 U/L (ref 0–53)
AST: 17 U/L (ref 0–37)
Albumin: 4.4 g/dL (ref 3.5–5.2)
Alkaline Phosphatase: 47 U/L (ref 39–117)
Bilirubin, Direct: 0.2 mg/dL (ref 0.0–0.3)
Total Bilirubin: 1.2 mg/dL (ref 0.2–1.2)
Total Protein: 6.7 g/dL (ref 6.0–8.3)

## 2020-01-30 LAB — BASIC METABOLIC PANEL
BUN: 12 mg/dL (ref 6–23)
CO2: 31 mEq/L (ref 19–32)
Calcium: 9.4 mg/dL (ref 8.4–10.5)
Chloride: 103 mEq/L (ref 96–112)
Creatinine, Ser: 1.16 mg/dL (ref 0.40–1.50)
GFR: 68.6 mL/min (ref >60.00)
Glucose, Bld: 76 mg/dL (ref 70–99)
Potassium: 4.6 mEq/L (ref 3.5–5.1)
Sodium: 137 mEq/L (ref 135–145)

## 2020-01-30 LAB — LIPID PANEL
Cholesterol: 192 mg/dL (ref 0–200)
HDL: 38.7 mg/dL — ABNORMAL LOW (ref >39.00)
LDL Cholesterol: 118 mg/dL — ABNORMAL HIGH (ref 0–99)
NonHDL: 153.01
Total CHOL/HDL Ratio: 5
Triglycerides: 173 mg/dL — ABNORMAL HIGH (ref 0.0–149.0)
VLDL: 34.6 mg/dL (ref 0.0–40.0)

## 2020-01-30 LAB — VITAMIN D 25 HYDROXY (VIT D DEFICIENCY, FRACTURES): VITD: 47.93 ng/mL (ref 30.00–100.00)

## 2020-01-30 LAB — PSA: PSA: 0.35 ng/mL (ref 0.10–4.00)

## 2020-01-30 LAB — TSH: TSH: 1.83 u[IU]/mL (ref 0.35–4.50)

## 2020-01-30 MED ORDER — CLOMIPHENE CITRATE 50 MG PO TABS: 50.0000 mg | ORAL_TABLET | Freq: Every day | ORAL | 0 refills | Status: DC

## 2020-02-11 ENCOUNTER — Encounter: Payer: Self-pay | Admitting: Internal Medicine

## 2020-02-13 ENCOUNTER — Encounter: Payer: Self-pay | Admitting: Internal Medicine

## 2020-02-14 NOTE — Telephone Encounter (Signed)
° °  Patient requesting response to MyChart message °

## 2020-02-15 NOTE — Telephone Encounter (Signed)
Previous Mychart message has already been sent to PCP for review. Will send again if there is no response by end of day today.

## 2020-02-18 ENCOUNTER — Other Ambulatory Visit: Payer: Self-pay | Admitting: Internal Medicine

## 2020-02-18 DIAGNOSIS — N522 Drug-induced erectile dysfunction: Secondary | ICD-10-CM

## 2020-02-18 MED ORDER — SILDENAFIL CITRATE 20 MG PO TABS: 80.0000 mg | ORAL_TABLET | Freq: Every day | ORAL | 5 refills | Status: DC | PRN

## 2020-02-22 DIAGNOSIS — M79662 Pain in left lower leg: Secondary | ICD-10-CM | POA: Insufficient documentation

## 2020-04-11 ENCOUNTER — Other Ambulatory Visit: Payer: Self-pay | Admitting: Internal Medicine

## 2020-04-11 DIAGNOSIS — N522 Drug-induced erectile dysfunction: Secondary | ICD-10-CM

## 2020-04-11 DIAGNOSIS — E291 Testicular hypofunction: Secondary | ICD-10-CM

## 2020-04-17 ENCOUNTER — Telehealth: Payer: Self-pay | Admitting: Internal Medicine

## 2020-04-17 ENCOUNTER — Other Ambulatory Visit: Payer: Self-pay | Admitting: Internal Medicine

## 2020-04-17 DIAGNOSIS — F5102 Adjustment insomnia: Secondary | ICD-10-CM

## 2020-04-17 MED ORDER — ESZOPICLONE 3 MG PO TABS: 3.0000 mg | ORAL_TABLET | Freq: Every day | ORAL | 3 refills | Status: DC

## 2020-04-17 NOTE — Telephone Encounter (Signed)
    [id number] [place] [place] - [address] FROM [address]

## 2020-04-24 ENCOUNTER — Encounter: Payer: Self-pay | Admitting: Internal Medicine

## 2020-04-24 DIAGNOSIS — I10 Essential (primary) hypertension: Secondary | ICD-10-CM

## 2020-04-24 MED ORDER — IRBESARTAN 150 MG PO TABS: 150.0000 mg | ORAL_TABLET | Freq: Every day | ORAL | 1 refills | Status: DC

## 2020-05-13 ENCOUNTER — Telehealth: Payer: Self-pay | Admitting: Physician Assistant

## 2020-05-13 ENCOUNTER — Encounter: Payer: Self-pay | Admitting: Internal Medicine

## 2020-05-13 NOTE — Telephone Encounter (Signed)
MRN has been given to the [facility] infusion group via secure Epic chat.

## 2020-05-13 NOTE — Telephone Encounter (Signed)
Called to discuss with Leon Carroll about Covid symptoms and the use of casirivimab and imdevimab, a monoclonal antibody infusion for those with mild to moderate Covid symptoms and at a high risk of hospitalization.     Pt is qualified for this infusion at the [facility] infusion center due to co-morbid conditions and/or a member of an at-risk group, however declines infusion at this time. Symptoms tier reviewed as well as criteria for ending isolation.  Symptoms reviewed that would warrant ED/Hospital evaluation. Preventative practices reviewed. Patient verbalized understanding. Patient advised to call back if he decides that he does want to get infusion. Callback number to the infusion center given. Patient advised to go to Urgent care or ED with severe symptoms.   Onset of symptoms 05/06/20 Hx of HTN and BMI > 25.   Patient Active Problem List   Diagnosis Date Noted  . Drug-induced erectile dysfunction 07/12/2019  . 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 Herpes simplex labialis 10/31/2017  . Hypogonadism male 10/25/2016  . Vitamin D deficiency 01/28/2016  . Hyperlipidemia with target LDL less than 130 09/19/2015  . Routine general medical examination at a health care facility 08/02/2013  . Essential hypertension, benign 08/02/2013  . 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

## 2020-05-14 ENCOUNTER — Other Ambulatory Visit: Payer: Self-pay | Admitting: Physician Assistant

## 2020-05-14 DIAGNOSIS — U071 COVID-19: Secondary | ICD-10-CM

## 2020-05-14 DIAGNOSIS — I10 Essential (primary) hypertension: Secondary | ICD-10-CM

## 2020-05-14 NOTE — Progress Notes (Signed)
I connected by phone with Leon Carroll on 05/14/2020 at 11:04 AM to discuss the potential use of a new treatment for mild to moderate COVID-19 viral infection in non-hospitalized patients.  This patient is a 44 y.o. male that meets the FDA criteria for Emergency Use Authorization of COVID monoclonal antibody casirivimab/imdevimab.  Has a (+) direct SARS-CoV-2 viral test result  Has mild or moderate COVID-19   Is NOT hospitalized due to COVID-19  Is within 10 days of symptom onset  Has at least one of the high risk factor(s) for progression to severe COVID-19 and/or hospitalization as defined in EUA.  Specific high risk criteria : BMI > 25 and Cardiovascular disease or hypertension   I have spoken and communicated the following to the patient or parent/caregiver regarding COVID monoclonal antibody treatment:  1. FDA has authorized the emergency use for the treatment of mild to moderate COVID-19 in adults and pediatric patients with positive results of direct SARS-CoV-2 viral testing who are 37 years of age and older weighing at least 40 kg, and who are at high risk for progressing to severe COVID-19 and/or hospitalization.  2. The significant known and potential risks and benefits of COVID monoclonal antibody, and the extent to which such potential risks and benefits are unknown.  3. Information on available alternative treatments and the risks and benefits of those alternatives, including clinical trials.  4. Patients treated with COVID monoclonal antibody should continue to self-isolate and use infection control measures (e.g., wear mask, isolate, social distance, avoid sharing personal items, clean and disinfect "high touch" surfaces, and frequent handwashing) according to CDC guidelines.   5. The patient or parent/caregiver has the option to accept or refuse COVID monoclonal antibody treatment.  After reviewing this information with the patient, The patient agreed to proceed  with receiving casirivimab\imdevimab infusion and will be provided a copy of the Fact sheet prior to receiving the infusion.   [name] 05/14/2020 11:04 AM

## 2020-05-15 ENCOUNTER — Ambulatory Visit (HOSPITAL_COMMUNITY)
Admission: RE | Admit: 2020-05-15 | Discharge: 2020-05-15 | Disposition: A | Discharge status: Home / Self Care | Payer: BC Managed Care – PPO | Source: Ambulatory Visit | Attending: Pulmonary Disease | Admitting: Pulmonary Disease

## 2020-05-15 DIAGNOSIS — U071 COVID-19: Secondary | ICD-10-CM | POA: Insufficient documentation

## 2020-05-15 MED ORDER — SODIUM CHLORIDE 0.9 % IV SOLN: INTRAVENOUS | Status: DC | PRN

## 2020-05-15 MED ORDER — EPINEPHRINE 0.3 MG/0.3ML IJ SOAJ: 0.3000 mg | Freq: Once | INTRAMUSCULAR | Status: DC | PRN

## 2020-05-15 MED ORDER — SODIUM CHLORIDE 0.9 % IV SOLN
1200.0000 mg | Freq: Once | INTRAVENOUS | Status: AC
  Administered 2020-05-15: 1200 mg via INTRAVENOUS

## 2020-05-15 MED ORDER — FAMOTIDINE IN NACL 20-0.9 MG/50ML-% IV SOLN: 20.0000 mg | Freq: Once | INTRAVENOUS | Status: DC | PRN

## 2020-05-15 MED ORDER — DIPHENHYDRAMINE HCL 50 MG/ML IJ SOLN: 50.0000 mg | Freq: Once | INTRAMUSCULAR | Status: DC | PRN

## 2020-05-15 MED ORDER — METHYLPREDNISOLONE SODIUM SUCC 125 MG IJ SOLR: 125.0000 mg | Freq: Once | INTRAMUSCULAR | Status: DC | PRN

## 2020-05-15 MED ORDER — ALBUTEROL SULFATE HFA 108 (90 BASE) MCG/ACT IN AERS: 2.0000 | INHALATION_SPRAY | Freq: Once | RESPIRATORY_TRACT | Status: DC | PRN

## 2020-05-15 NOTE — Progress Notes (Addendum)
  Diagnosis: COVID-19  Physician: Dr Wright  Procedure: [facility] Med: casirivimab\imdevimab infusion - Provided patient with casirivimab\imdevimab fact sheet for patients, parents and caregivers prior to infusion.  Complications: No immediate complications noted.    Discharge: Discharged home   Leon Carroll 05/15/2020   

## 2020-05-15 NOTE — Discharge Instructions (Signed)

## 2020-05-19 DIAGNOSIS — Z20828 Contact with and (suspected) exposure to other viral communicable diseases: Secondary | ICD-10-CM | POA: Diagnosis not present

## 2020-05-23 DIAGNOSIS — Z20828 Contact with and (suspected) exposure to other viral communicable diseases: Secondary | ICD-10-CM | POA: Diagnosis not present

## 2020-05-25 DIAGNOSIS — Z20828 Contact with and (suspected) exposure to other viral communicable diseases: Secondary | ICD-10-CM | POA: Diagnosis not present

## 2020-08-25 ENCOUNTER — Encounter: Payer: Self-pay | Admitting: Internal Medicine

## 2020-08-28 DIAGNOSIS — Z03818 Encounter for observation for suspected exposure to other biological agents ruled out: Secondary | ICD-10-CM | POA: Diagnosis not present

## 2020-09-02 ENCOUNTER — Telehealth: Payer: Self-pay | Admitting: Internal Medicine

## 2020-09-02 NOTE — Telephone Encounter (Signed)
Eszopiclone (ESZOPICLONE) 3 MG TABS [facility] [address] - [place], [place] - [address] Phone:  [phone]  Fax:  [phone]     Last seen- 06.01.21 [facility] [address]- N/A  Patient would like a refill of this medication, also it is being sent to a new pharmacy that is attached to this message

## 2020-09-04 NOTE — Telephone Encounter (Signed)
Called patient and made an appointment for 01.11.22

## 2020-09-04 NOTE — Telephone Encounter (Signed)
Patient calling back checking on the status of the refill

## 2020-09-05 ENCOUNTER — Other Ambulatory Visit: Payer: Self-pay | Admitting: Internal Medicine

## 2020-09-05 DIAGNOSIS — F5102 Adjustment insomnia: Secondary | ICD-10-CM

## 2020-09-05 MED ORDER — ESZOPICLONE 3 MG PO TABS: 3.0000 mg | ORAL_TABLET | Freq: Every day | ORAL | 0 refills | Status: DC

## 2020-09-09 ENCOUNTER — Ambulatory Visit: Payer: BC Managed Care – PPO | Admitting: Internal Medicine

## 2020-09-09 ENCOUNTER — Other Ambulatory Visit: Payer: Self-pay

## 2020-09-09 ENCOUNTER — Encounter: Payer: Self-pay | Admitting: Internal Medicine

## 2020-09-09 ENCOUNTER — Other Ambulatory Visit (INDEPENDENT_AMBULATORY_CARE_PROVIDER_SITE_OTHER): Payer: BC Managed Care – PPO

## 2020-09-09 VITALS — BP 136/78 | HR 83 | Temp 98.2°F | Resp 16 | Ht 73.0 in | Wt 228.0 lb

## 2020-09-09 DIAGNOSIS — I10 Essential (primary) hypertension: Secondary | ICD-10-CM

## 2020-09-09 DIAGNOSIS — N522 Drug-induced erectile dysfunction: Secondary | ICD-10-CM

## 2020-09-09 DIAGNOSIS — Z23 Encounter for immunization: Secondary | ICD-10-CM

## 2020-09-09 DIAGNOSIS — E291 Testicular hypofunction: Secondary | ICD-10-CM

## 2020-09-09 DIAGNOSIS — E559 Vitamin D deficiency, unspecified: Secondary | ICD-10-CM

## 2020-09-09 LAB — BASIC METABOLIC PANEL
BUN: 10 mg/dL (ref 6–23)
CO2: 31 mEq/L (ref 19–32)
Calcium: 9.6 mg/dL (ref 8.4–10.5)
Chloride: 105 mEq/L (ref 96–112)
Creatinine, Ser: 1.15 mg/dL (ref 0.40–1.50)
GFR: 77.69 mL/min (ref >60.00)
Glucose, Bld: 86 mg/dL (ref 70–99)
Potassium: 4.1 mEq/L (ref 3.5–5.1)
Sodium: 139 mEq/L (ref 135–145)

## 2020-09-09 LAB — CBC WITH DIFFERENTIAL/PLATELET
Basophils Absolute: 0 10*3/uL (ref 0.0–0.1)
Basophils Relative: 0.7 % (ref 0.0–3.0)
Eosinophils Absolute: 0.1 10*3/uL (ref 0.0–0.7)
Eosinophils Relative: 2.7 % (ref 0.0–5.0)
HCT: 44.9 % (ref 39.0–52.0)
Hemoglobin: 15.3 g/dL (ref 13.0–17.0)
Lymphocytes Relative: 42.3 % (ref 12.0–46.0)
Lymphs Abs: 2 10*3/uL (ref 0.7–4.0)
MCHC: 34.1 g/dL (ref 30.0–36.0)
MCV: 92.5 fl (ref 78.0–100.0)
Monocytes Absolute: 0.3 10*3/uL (ref 0.1–1.0)
Monocytes Relative: 6.3 % (ref 3.0–12.0)
Neutro Abs: 2.3 10*3/uL (ref 1.4–7.7)
Neutrophils Relative %: 48 % (ref 43.0–77.0)
Platelets: 225 10*3/uL (ref 150.0–400.0)
RBC: 4.85 Mil/uL (ref 4.22–5.81)
RDW: 13 % (ref 11.5–15.5)
WBC: 4.8 10*3/uL (ref 4.0–10.5)

## 2020-09-09 MED ORDER — TADALAFIL 20 MG PO TABS: 20.0000 mg | ORAL_TABLET | Freq: Every day | ORAL | 5 refills | Status: DC | PRN

## 2020-09-09 NOTE — Patient Instructions (Signed)

## 2020-09-09 NOTE — Progress Notes (Unsigned)
Subjective:  Patient ID: Leon Carroll, male    DOB: May 12, 1976  Age: 45 y.o. MRN: [id number]  CC: Hypertension  This visit occurred during the SARS-CoV-2 public health emergency.  Safety protocols were in place, including screening questions prior to the visit, additional usage of staff PPE, and extensive cleaning of exam room while observing appropriate contact time as indicated for disinfecting solutions.    HPI Leon Carroll presents for f/up - He tells me his blood pressure has been well controlled.  He is active and denies any recent episodes of dizziness, lightheadedness, chest pain, shortness of breath, palpitations, edema, and fatigue.  He continues to complain of erectile dysfunction and insomnia.  Outpatient Medications Prior to Visit  Medication Sig Dispense Refill  . clomiPHENE (CLOMID) 50 MG tablet TAKE 1 TABLET BY MOUTH EVERY DAY 30 tablet 2  . Eszopiclone (ESZOPICLONE) 3 MG TABS Take 1 tablet (3 mg total) by mouth at bedtime. Take immediately before bedtime 30 tablet 0  . irbesartan (AVAPRO) 150 MG tablet Take 1 tablet (150 mg total) by mouth daily. 90 tablet 1  . valACYclovir (VALTREX) 1000 MG tablet Take 1,000 mg by mouth 2 (two) times daily.    . sildenafil (REVATIO) 20 MG tablet TAKE 4 TABLETS (80 MG TOTAL) BY MOUTH DAILY AS NEEDED. 60 tablet 10   No facility-administered medications prior to visit.    ROS Review of Systems  Constitutional: Negative for diaphoresis and fatigue.  HENT: Negative.   Eyes: Negative.   Respiratory: Negative for cough, chest tightness, shortness of breath and wheezing.   Cardiovascular: Negative for chest pain, palpitations and leg swelling.  Gastrointestinal: Negative for abdominal pain, diarrhea, nausea and vomiting.  Endocrine: Negative.   Genitourinary: Negative.  Negative for difficulty urinating, dysuria, hematuria and urgency.  Musculoskeletal: Negative.   Skin: Negative.  Negative for color change and pallor.   Neurological: Negative.  Negative for dizziness, weakness and light-headedness.  Hematological: Negative for adenopathy. Does not bruise/bleed easily.  Psychiatric/Behavioral: Positive for sleep disturbance. Negative for dysphoric mood and suicidal ideas. The patient is not nervous/anxious.   All other systems reviewed and are negative.   Objective:  BP 136/78   Pulse 83   Temp 98.2 F (36.8 C) (Oral)   Resp 16   Ht 6\' 1"  (1.854 m)   Wt 228 lb (103.4 kg)   SpO2 98%   BMI 30.08 kg/m   BP Readings from Last 3 Encounters:  09/09/20 136/78  05/15/20 122/67  01/29/20 124/74    Wt Readings from Last 3 Encounters:  09/09/20 228 lb (103.4 kg)  01/29/20 231 lb (104.8 kg)  04/02/19 245 lb (111.1 kg)    Physical Exam Vitals reviewed.  Constitutional:      Appearance: Normal appearance.  HENT:     Nose: Nose normal.     Mouth/Throat:     Mouth: Mucous membranes are moist.  Eyes:     General: No scleral icterus.    Conjunctiva/sclera: Conjunctivae normal.  Cardiovascular:     Rate and Rhythm: Normal rate and regular rhythm.     Heart sounds: No murmur heard.   Pulmonary:     Effort: Pulmonary effort is normal.     Breath sounds: No stridor. No wheezing, rhonchi or rales.  Abdominal:     General: Abdomen is flat. Bowel sounds are normal. There is no distension.     Palpations: Abdomen is soft. There is no hepatomegaly, splenomegaly or mass.  Musculoskeletal:  General: Normal range of motion.     Cervical back: Neck supple.     Right lower leg: No edema.     Left lower leg: No edema.  Lymphadenopathy:     Cervical: No cervical adenopathy.  Skin:    General: Skin is warm and dry.  Neurological:     General: No focal deficit present.     Mental Status: He is alert.  Psychiatric:        Mood and Affect: Mood normal.        Behavior: Behavior normal.        Thought Content: Thought content normal.        Judgment: Judgment normal.     Lab Results   Component Value Date   WBC 4.8 09/09/2020   HGB 15.3 09/09/2020   HCT 44.9 09/09/2020   PLT 225.0 09/09/2020   GLUCOSE 86 09/09/2020   CHOL 192 01/29/2020   TRIG 173.0 (H) 01/29/2020   HDL 38.70 (L) 01/29/2020   LDLDIRECT 173.0 08/02/2013   LDLCALC 118 (H) 01/29/2020   ALT 14 01/29/2020   AST 17 01/29/2020   NA 139 09/09/2020   K 4.1 09/09/2020   CL 105 09/09/2020   CREATININE 1.15 09/09/2020   BUN 10 09/09/2020   CO2 31 09/09/2020   TSH 1.83 01/29/2020   PSA 0.35 01/29/2020    No results found.  Assessment & Plan:   Leon Carroll was seen today for hypertension.  Diagnoses and all orders for this visit:  Hypogonadism male- His testosterone level is normal with the current dose of clomiphene. -     Testosterone Total,Free,Bio, Males; Future  Essential hypertension, benign- His blood pressure is well controlled.  Electrolytes and renal function are normal. -     Basic metabolic panel; Future -     CBC with Differential/Platelet; Future  Drug-induced erectile dysfunction -     Testosterone Total,Free,Bio, Males; Future -     tadalafil (CIALIS) 20 MG tablet; Take 1 tablet (20 mg total) by mouth daily as needed for erectile dysfunction.  Vitamin D deficiency  Other orders -     Flu Vaccine QUAD 6+ mos PF IM (Fluarix Quad PF)   I have discontinued Leon Carroll. [facility]'s sildenafil. I am also having him start on tadalafil. Additionally, I am having him maintain his valACYclovir, clomiPHENE, irbesartan, and Eszopiclone.  Meds ordered this encounter  Medications  . tadalafil (CIALIS) 20 MG tablet    Sig: Take 1 tablet (20 mg total) by mouth daily as needed for erectile dysfunction.    Dispense:  10 tablet    Refill:  5     Follow-up: Return in about 6 months (around 03/09/2021).  Sanda Linger, MD

## 2020-09-10 LAB — TESTOSTERONE TOTAL,FREE,BIO, MALES
Albumin: 4.1 g/dL (ref 3.6–5.1)
Sex Hormone Binding: 35 nmol/L (ref 10–50)
Testosterone, Bioavailable: 196.8 ng/dL (ref >=110.0)
Testosterone, Free: 104.6 pg/mL (ref 46.0–224.0)
Testosterone: 734 ng/dL (ref 250–827)

## 2020-09-11 DIAGNOSIS — Z03818 Encounter for observation for suspected exposure to other biological agents ruled out: Secondary | ICD-10-CM | POA: Diagnosis not present

## 2020-09-23 ENCOUNTER — Encounter: Payer: Self-pay | Admitting: Internal Medicine

## 2020-09-23 DIAGNOSIS — U071 COVID-19: Secondary | ICD-10-CM | POA: Diagnosis not present

## 2020-10-03 ENCOUNTER — Other Ambulatory Visit: Payer: Self-pay | Admitting: Internal Medicine

## 2020-10-03 DIAGNOSIS — F5102 Adjustment insomnia: Secondary | ICD-10-CM

## 2020-10-10 ENCOUNTER — Other Ambulatory Visit: Payer: Self-pay | Admitting: Internal Medicine

## 2020-10-10 DIAGNOSIS — I10 Essential (primary) hypertension: Secondary | ICD-10-CM

## 2020-10-22 DIAGNOSIS — Z20822 Contact with and (suspected) exposure to covid-19: Secondary | ICD-10-CM | POA: Diagnosis not present

## 2020-10-22 DIAGNOSIS — Z03818 Encounter for observation for suspected exposure to other biological agents ruled out: Secondary | ICD-10-CM | POA: Diagnosis not present

## 2020-10-29 DIAGNOSIS — Z20822 Contact with and (suspected) exposure to covid-19: Secondary | ICD-10-CM | POA: Diagnosis not present

## 2020-10-29 DIAGNOSIS — Z03818 Encounter for observation for suspected exposure to other biological agents ruled out: Secondary | ICD-10-CM | POA: Diagnosis not present

## 2020-12-23 ENCOUNTER — Encounter: Payer: Self-pay | Admitting: Internal Medicine

## 2021-01-20 DIAGNOSIS — Z653 Problems related to other legal circumstances: Secondary | ICD-10-CM | POA: Diagnosis not present

## 2021-01-20 DIAGNOSIS — F4320 Adjustment disorder, unspecified: Secondary | ICD-10-CM | POA: Diagnosis not present

## 2021-01-28 DIAGNOSIS — Z653 Problems related to other legal circumstances: Secondary | ICD-10-CM | POA: Diagnosis not present

## 2021-01-28 DIAGNOSIS — F4320 Adjustment disorder, unspecified: Secondary | ICD-10-CM | POA: Diagnosis not present

## 2021-02-04 DIAGNOSIS — F4320 Adjustment disorder, unspecified: Secondary | ICD-10-CM | POA: Diagnosis not present

## 2021-02-04 DIAGNOSIS — Z653 Problems related to other legal circumstances: Secondary | ICD-10-CM | POA: Diagnosis not present

## 2021-03-04 DIAGNOSIS — F4320 Adjustment disorder, unspecified: Secondary | ICD-10-CM | POA: Diagnosis not present

## 2021-03-04 DIAGNOSIS — Z653 Problems related to other legal circumstances: Secondary | ICD-10-CM | POA: Diagnosis not present

## 2021-03-13 ENCOUNTER — Encounter: Payer: Self-pay | Admitting: Internal Medicine

## 2021-03-18 ENCOUNTER — Ambulatory Visit: Payer: BC Managed Care – PPO | Admitting: Internal Medicine

## 2021-03-18 ENCOUNTER — Encounter: Payer: Self-pay | Admitting: Internal Medicine

## 2021-03-18 ENCOUNTER — Other Ambulatory Visit: Payer: Self-pay

## 2021-03-18 VITALS — BP 122/78 | HR 76 | Temp 98.2°F | Ht 73.0 in | Wt 225.0 lb

## 2021-03-18 DIAGNOSIS — E559 Vitamin D deficiency, unspecified: Secondary | ICD-10-CM

## 2021-03-18 DIAGNOSIS — Z0000 Encounter for general adult medical examination without abnormal findings: Secondary | ICD-10-CM

## 2021-03-18 DIAGNOSIS — H65492 Other chronic nonsuppurative otitis media, left ear: Secondary | ICD-10-CM | POA: Insufficient documentation

## 2021-03-18 DIAGNOSIS — Z1159 Encounter for screening for other viral diseases: Secondary | ICD-10-CM | POA: Insufficient documentation

## 2021-03-18 DIAGNOSIS — E291 Testicular hypofunction: Secondary | ICD-10-CM | POA: Diagnosis not present

## 2021-03-18 DIAGNOSIS — I10 Essential (primary) hypertension: Secondary | ICD-10-CM | POA: Diagnosis not present

## 2021-03-18 DIAGNOSIS — H9192 Unspecified hearing loss, left ear: Secondary | ICD-10-CM | POA: Insufficient documentation

## 2021-03-18 DIAGNOSIS — N522 Drug-induced erectile dysfunction: Secondary | ICD-10-CM | POA: Diagnosis not present

## 2021-03-18 DIAGNOSIS — E785 Hyperlipidemia, unspecified: Secondary | ICD-10-CM

## 2021-03-18 LAB — BASIC METABOLIC PANEL
BUN: 11 mg/dL (ref 6–23)
CO2: 28 mEq/L (ref 19–32)
Calcium: 9.3 mg/dL (ref 8.4–10.5)
Chloride: 102 mEq/L (ref 96–112)
Creatinine, Ser: 1.17 mg/dL (ref 0.40–1.50)
GFR: 75.82 mL/min (ref >60.00)
Glucose, Bld: 96 mg/dL (ref 70–99)
Potassium: 4 mEq/L (ref 3.5–5.1)
Sodium: 137 mEq/L (ref 135–145)

## 2021-03-18 LAB — LIPID PANEL
Cholesterol: 183 mg/dL (ref 0–200)
HDL: 42.2 mg/dL (ref >39.00)
LDL Cholesterol: 126 mg/dL — ABNORMAL HIGH (ref 0–99)
NonHDL: 141.12
Total CHOL/HDL Ratio: 4
Triglycerides: 78 mg/dL (ref 0.0–149.0)
VLDL: 15.6 mg/dL (ref 0.0–40.0)

## 2021-03-18 LAB — VITAMIN D 25 HYDROXY (VIT D DEFICIENCY, FRACTURES): VITD: 44.68 ng/mL (ref 30.00–100.00)

## 2021-03-18 MED ORDER — AMOXICILLIN-POT CLAVULANATE 875-125 MG PO TABS: 1.0000 | ORAL_TABLET | Freq: Two times a day (BID) | ORAL | 0 refills | Status: DC

## 2021-03-18 MED ORDER — TADALAFIL 20 MG PO TABS: 20.0000 mg | ORAL_TABLET | Freq: Every day | ORAL | 5 refills | Status: DC | PRN

## 2021-03-18 NOTE — Progress Notes (Signed)
hearng  Subjective:  Patient ID: Leon Carroll, male    DOB: 1975-11-08  Age: 45 y.o. MRN: [id number]  CC: Annual Exam and Hypertension  This visit occurred during the SARS-CoV-2 public health emergency.  Safety protocols were in place, including screening questions prior to the visit, additional usage of staff PPE, and extensive cleaning of exam room while observing appropriate contact time as indicated for disinfecting solutions.    HPI Leon Carroll presents for a CPX.  He complains of a several week history of left-sided earache and gradual loss of hearing.  He felt like he had an ear infection so he has been using an eardrop.  He denies rash, lymphadenopathy, fever, or chills.  He exercises and does not experience CP, DOE, palpitations, edema, or fatigue.  Outpatient Medications Prior to Visit  Medication Sig Dispense Refill   clomiPHENE (CLOMID) 50 MG tablet TAKE 1 TABLET BY MOUTH EVERY DAY 30 tablet 2   Eszopiclone 3 MG TABS TAKE ONE TABLET BY MOUTH EVERY NIGHT AT BEDTIME 90 tablet 1   irbesartan (AVAPRO) 150 MG tablet TAKE 1 TABLET BY MOUTH EVERY DAY 90 tablet 1   valACYclovir (VALTREX) 1000 MG tablet Take 1,000 mg by mouth 2 (two) times daily.     tadalafil (CIALIS) 20 MG tablet Take 1 tablet (20 mg total) by mouth daily as needed for erectile dysfunction. 10 tablet 5   No facility-administered medications prior to visit.    ROS Review of Systems  Constitutional:  Negative for chills, fatigue and fever.  HENT:  Positive for ear pain. Negative for ear discharge, facial swelling and sore throat.   Eyes: Negative.   Respiratory:  Negative for cough, chest tightness, shortness of breath and wheezing.   Cardiovascular:  Negative for chest pain, palpitations and leg swelling.  Gastrointestinal:  Negative for abdominal pain, diarrhea, nausea and vomiting.  Endocrine: Negative.   Genitourinary: Negative.  Negative for scrotal swelling and testicular pain.       ++ED   Musculoskeletal: Negative.  Negative for arthralgias, back pain, myalgias and neck pain.  Skin: Negative.   Neurological: Negative.  Negative for dizziness, weakness, light-headedness and headaches.  Hematological:  Negative for adenopathy. Does not bruise/bleed easily.  Psychiatric/Behavioral: Negative.     Objective:  BP 122/78 (BP Location: Left Arm, Patient Position: Sitting, Cuff Size: Large)   Pulse 76   Temp 98.2 F (36.8 C) (Oral)   Ht 6\' 1"  (1.854 m)   Wt 225 lb (102.1 kg)   SpO2 97%   BMI 29.69 kg/m   BP Readings from Last 3 Encounters:  03/18/21 122/78  09/09/20 136/78  05/15/20 122/67    Wt Readings from Last 3 Encounters:  03/18/21 225 lb (102.1 kg)  09/09/20 228 lb (103.4 kg)  01/29/20 231 lb (104.8 kg)    Physical Exam Vitals reviewed.  Constitutional:      Appearance: Normal appearance.  HENT:     Right Ear: Hearing, tympanic membrane, ear canal and external ear normal.     Left Ear: Decreased hearing noted. A middle ear effusion is present. There is no impacted cerumen. Tympanic membrane is scarred.     Ears:     Comments: Left TM is scarred and there appears to be pus behind the tympanic membrane.    Nose: Nose normal.     Mouth/Throat:     Mouth: Mucous membranes are moist.  Eyes:     General: No scleral icterus.    Conjunctiva/sclera: Conjunctivae normal.  Cardiovascular:     Rate and Rhythm: Normal rate and regular rhythm.     Heart sounds: No murmur heard. Pulmonary:     Effort: Pulmonary effort is normal.     Breath sounds: No stridor. No wheezing, rhonchi or rales.  Abdominal:     General: Abdomen is flat.     Palpations: There is no mass.     Tenderness: There is no abdominal tenderness. There is no guarding.     Hernia: No hernia is present.  Musculoskeletal:        General: Normal range of motion.     Cervical back: Neck supple.     Right lower leg: No edema.     Left lower leg: No edema.  Lymphadenopathy:     Cervical: No  cervical adenopathy.  Skin:    General: Skin is warm and dry.     Findings: No rash.  Neurological:     General: No focal deficit present.     Mental Status: He is alert. Mental status is at baseline.    Lab Results  Component Value Date   WBC 4.8 09/09/2020   HGB 15.3 09/09/2020   HCT 44.9 09/09/2020   PLT 225.0 09/09/2020   GLUCOSE 96 03/18/2021   CHOL 183 03/18/2021   TRIG 78.0 03/18/2021   HDL 42.20 03/18/2021   LDLDIRECT 173.0 08/02/2013   LDLCALC 126 (H) 03/18/2021   ALT 14 01/29/2020   AST 17 01/29/2020   NA 137 03/18/2021   K 4.0 03/18/2021   CL 102 03/18/2021   CREATININE 1.17 03/18/2021   BUN 11 03/18/2021   CO2 28 03/18/2021   TSH 1.83 01/29/2020   PSA 0.35 01/29/2020    No results found.  Assessment & Plan:   Leon Carroll was seen today for annual exam and hypertension.  Diagnoses and all orders for this visit:  Essential hypertension, benign- His blood pressure is well controlled. -     Basic metabolic panel; Future -     VITAMIN D 25 Hydroxy (Vit-D Deficiency, Fractures); Future -     VITAMIN D 25 Hydroxy (Vit-D Deficiency, Fractures) -     Basic metabolic panel  Routine general medical examination at a health care facility- Exam completed, labs reviewed, vaccines are up-to-date, no cancer screenings indicated, patient education was given. -     Lipid panel; Future -     Hepatitis C antibody; Future -     Hepatitis C antibody -     Lipid panel  Vitamin D deficiency -     VITAMIN D 25 Hydroxy (Vit-D Deficiency, Fractures); Future -     VITAMIN D 25 Hydroxy (Vit-D Deficiency, Fractures)  Hyperlipidemia with target LDL less than 130  Hearing loss of left ear, unspecified hearing loss type -     Ambulatory referral to Audiology  Chronic exudative otitis media, left -     amoxicillin-clavulanate (AUGMENTIN) 875-125 MG tablet; Take 1 tablet by mouth 2 (two) times daily.  Drug-induced erectile dysfunction -     tadalafil (CIALIS) 20 MG tablet; Take 1  tablet (20 mg total) by mouth daily as needed for erectile dysfunction.  Hypogonadism male- His T level is normal now. -     Testosterone Total,Free,Bio, Males; Future -     Testosterone Total,Free,Bio, Males  Need for hepatitis C screening test -     Hepatitis C antibody; Future -     Hepatitis C antibody  I am having [name]. Boettger start on amoxicillin-clavulanate. I  am also having him maintain his valACYclovir, clomiPHENE, Eszopiclone, irbesartan, and tadalafil.  Meds ordered this encounter  Medications   amoxicillin-clavulanate (AUGMENTIN) 875-125 MG tablet    Sig: Take 1 tablet by mouth 2 (two) times daily.    Dispense:  20 tablet    Refill:  0   tadalafil (CIALIS) 20 MG tablet    Sig: Take 1 tablet (20 mg total) by mouth daily as needed for erectile dysfunction.    Dispense:  10 tablet    Refill:  5      Follow-up: Return in about 6 months (around 09/18/2021).  Sanda Linger, MD

## 2021-03-18 NOTE — Patient Instructions (Signed)
Health Maintenance, Male Adopting a healthy lifestyle and getting preventive care are important in promoting health and wellness. Ask your health care provider about: The right schedule for you to have regular tests and exams. Things you can do on your own to prevent diseases and keep yourself healthy. What should I know about diet, weight, and exercise? Eat a healthy diet  Eat a diet that includes plenty of vegetables, fruits, low-fat dairy products, and lean protein. Do not eat a lot of foods that are high in solid fats, added sugars, or sodium.  Maintain a healthy weight Body mass index (BMI) is a measurement that can be used to identify possible weight problems. It estimates body fat based on height and weight. Your health care provider can help determine your BMI and help you achieve or maintain ahealthy weight. Get regular exercise Get regular exercise. This is one of the most important things you can do for your health. Most adults should: Exercise for at least 150 minutes each week. The exercise should increase your heart rate and make you sweat (moderate-intensity exercise). Do strengthening exercises at least twice a week. This is in addition to the moderate-intensity exercise. Spend less time sitting. Even light physical activity can be beneficial. Watch cholesterol and blood lipids Have your blood tested for lipids and cholesterol at 44 years of age, then havethis test every 5 years. You may need to have your cholesterol levels checked more often if: Your lipid or cholesterol levels are high. You are older than 44 years of age. You are at high risk for heart disease. What should I know about cancer screening? Many types of cancers can be detected early and may often be prevented. Depending on your health history and family history, you may need to have cancer screening at various ages. This may include screening for: Colorectal cancer. Prostate cancer. Skin cancer. Lung  cancer. What should I know about heart disease, diabetes, and high blood pressure? Blood pressure and heart disease High blood pressure causes heart disease and increases the risk of stroke. This is more likely to develop in people who have high blood pressure readings, are of African descent, or are overweight. Talk with your health care provider about your target blood pressure readings. Have your blood pressure checked: Every 3-5 years if you are 18-39 years of age. Every year if you are 40 years old or older. If you are between the ages of 65 and 75 and are a current or former smoker, ask your health care provider if you should have a one-time screening for abdominal aortic aneurysm (AAA). Diabetes Have regular diabetes screenings. This checks your fasting blood sugar level. Have the screening done: Once every three years after age 45 if you are at a normal weight and have a low risk for diabetes. More often and at a younger age if you are overweight or have a high risk for diabetes. What should I know about preventing infection? Hepatitis B If you have a higher risk for hepatitis B, you should be screened for this virus. Talk with your health care provider to find out if you are at risk forhepatitis B infection. Hepatitis C Blood testing is recommended for: Everyone born from 1945 through 1965. Anyone with known risk factors for hepatitis C. Sexually transmitted infections (STIs) You should be screened each year for STIs, including gonorrhea and chlamydia, if: You are sexually active and are younger than 44 years of age. You are older than 44 years of age   and your health care provider tells you that you are at risk for this type of infection. Your sexual activity has changed since you were last screened, and you are at increased risk for chlamydia or gonorrhea. Ask your health care provider if you are at risk. Ask your health care provider about whether you are at high risk for HIV.  Your health care provider may recommend a prescription medicine to help prevent HIV infection. If you choose to take medicine to prevent HIV, you should first get tested for HIV. You should then be tested every 3 months for as long as you are taking the medicine. Follow these instructions at home: Lifestyle Do not use any products that contain nicotine or tobacco, such as cigarettes, e-cigarettes, and chewing tobacco. If you need help quitting, ask your health care provider. Do not use street drugs. Do not share needles. Ask your health care provider for help if you need support or information about quitting drugs. Alcohol use Do not drink alcohol if your health care provider tells you not to drink. If you drink alcohol: Limit how much you have to 0-2 drinks a day. Be aware of how much alcohol is in your drink. In the [place] one drink equals one 12 oz bottle of beer (355 mL), one 5 oz glass of wine (148 mL), or one 1 oz glass of hard liquor (44 mL). General instructions Schedule regular health, dental, and eye exams. Stay current with your vaccines. Tell your health care provider if: You often feel depressed. You have ever been abused or do not feel safe at home. Summary Adopting a healthy lifestyle and getting preventive care are important in promoting health and wellness. Follow your health care provider's instructions about healthy diet, exercising, and getting tested or screened for diseases. Follow your health care provider's instructions on monitoring your cholesterol and blood pressure. This information is not intended to replace advice given to you by your health care provider. Make sure you discuss any questions you have with your healthcare provider. Document Revised: 08/09/2018 Document Reviewed: 08/09/2018 Elsevier Patient Education  2022 Elsevier Inc.  

## 2021-03-19 LAB — HEPATITIS C ANTIBODY
Hepatitis C Ab: NONREACTIVE
SIGNAL TO CUT-OFF: 0.02 (ref <1.00)

## 2021-03-19 LAB — TESTOSTERONE TOTAL,FREE,BIO, MALES
Albumin: 4.3 g/dL (ref 3.6–5.1)
Sex Hormone Binding: 32 nmol/L (ref 10–50)
Testosterone, Bioavailable: 147.8 ng/dL (ref 110.0–575.0)
Testosterone, Free: 75.1 pg/mL (ref 46.0–224.0)
Testosterone: 529 ng/dL (ref 250–827)

## 2021-03-31 ENCOUNTER — Other Ambulatory Visit: Payer: Self-pay | Admitting: Internal Medicine

## 2021-03-31 ENCOUNTER — Encounter: Payer: Self-pay | Admitting: Internal Medicine

## 2021-03-31 DIAGNOSIS — F5102 Adjustment insomnia: Secondary | ICD-10-CM

## 2021-03-31 MED ORDER — ESZOPICLONE 3 MG PO TABS: 3.0000 mg | ORAL_TABLET | Freq: Every day | ORAL | 1 refills | Status: DC

## 2021-04-01 DIAGNOSIS — F4320 Adjustment disorder, unspecified: Secondary | ICD-10-CM | POA: Diagnosis not present

## 2021-04-01 DIAGNOSIS — Z653 Problems related to other legal circumstances: Secondary | ICD-10-CM | POA: Diagnosis not present

## 2021-04-06 ENCOUNTER — Encounter: Payer: Self-pay | Admitting: Internal Medicine

## 2021-04-08 ENCOUNTER — Telehealth: Payer: Self-pay | Admitting: Internal Medicine

## 2021-04-08 NOTE — Telephone Encounter (Signed)
Patient sent message to provider via MyChart 08.08 & was calling in bc he has not gotten a reponse  Please review prev MyChart message left by patient  Patient says provider can either call [phone] or respond via MyChart

## 2021-04-09 ENCOUNTER — Other Ambulatory Visit: Payer: Self-pay | Admitting: Internal Medicine

## 2021-04-09 DIAGNOSIS — F411 Generalized anxiety disorder: Secondary | ICD-10-CM

## 2021-04-09 MED ORDER — CLONAZEPAM 0.5 MG PO TABS: 0.5000 mg | ORAL_TABLET | Freq: Two times a day (BID) | ORAL | 2 refills | Status: DC | PRN

## 2021-04-15 ENCOUNTER — Other Ambulatory Visit: Payer: Self-pay | Admitting: Internal Medicine

## 2021-04-15 DIAGNOSIS — I10 Essential (primary) hypertension: Secondary | ICD-10-CM

## 2021-04-16 ENCOUNTER — Ambulatory Visit: Payer: BC Managed Care – PPO | Admitting: Audiologist

## 2021-04-24 ENCOUNTER — Encounter: Payer: Self-pay | Admitting: Internal Medicine

## 2021-04-24 DIAGNOSIS — N522 Drug-induced erectile dysfunction: Secondary | ICD-10-CM

## 2021-04-24 MED ORDER — TADALAFIL 20 MG PO TABS: 20.0000 mg | ORAL_TABLET | Freq: Every day | ORAL | 5 refills | Status: DC | PRN

## 2021-05-06 DIAGNOSIS — F4320 Adjustment disorder, unspecified: Secondary | ICD-10-CM | POA: Diagnosis not present

## 2021-05-06 DIAGNOSIS — Z653 Problems related to other legal circumstances: Secondary | ICD-10-CM | POA: Diagnosis not present

## 2021-05-07 ENCOUNTER — Ambulatory Visit: Payer: BC Managed Care – PPO | Admitting: Audiologist

## 2021-05-18 ENCOUNTER — Ambulatory Visit: Payer: BC Managed Care – PPO | Attending: Audiologist | Admitting: Audiologist

## 2021-05-20 DIAGNOSIS — F4320 Adjustment disorder, unspecified: Secondary | ICD-10-CM | POA: Diagnosis not present

## 2021-05-20 DIAGNOSIS — Z653 Problems related to other legal circumstances: Secondary | ICD-10-CM | POA: Diagnosis not present

## 2021-06-17 DIAGNOSIS — F4320 Adjustment disorder, unspecified: Secondary | ICD-10-CM | POA: Diagnosis not present

## 2021-06-17 DIAGNOSIS — Z653 Problems related to other legal circumstances: Secondary | ICD-10-CM | POA: Diagnosis not present

## 2021-07-02 ENCOUNTER — Encounter: Payer: Self-pay | Admitting: Internal Medicine

## 2021-07-03 ENCOUNTER — Other Ambulatory Visit: Payer: Self-pay | Admitting: Internal Medicine

## 2021-07-03 NOTE — Telephone Encounter (Signed)
1.Medication Requested: Eszopiclone 3 MG TABS  2. Pharmacy (Name, Street, City):[facility] [id number] - [place], [place] - [address]  3. On Med List: yes   4. Last Visit with PCP: 03-18-2021  5. Next visit date with PCP: n/a   Agent: Please be advised that RX refills may take up to 3 business days. We ask that you follow-up with your pharmacy.   Patient states he has 2 doses of medication left

## 2021-07-05 ENCOUNTER — Other Ambulatory Visit: Payer: Self-pay | Admitting: Internal Medicine

## 2021-07-05 DIAGNOSIS — F5102 Adjustment insomnia: Secondary | ICD-10-CM

## 2021-07-05 MED ORDER — ESZOPICLONE 3 MG PO TABS: 3.0000 mg | ORAL_TABLET | Freq: Every day | ORAL | 0 refills | Status: DC

## 2021-07-15 DIAGNOSIS — Z653 Problems related to other legal circumstances: Secondary | ICD-10-CM | POA: Diagnosis not present

## 2021-07-15 DIAGNOSIS — F4320 Adjustment disorder, unspecified: Secondary | ICD-10-CM | POA: Diagnosis not present

## 2021-08-11 DIAGNOSIS — M79644 Pain in right finger(s): Secondary | ICD-10-CM | POA: Insufficient documentation

## 2021-09-30 ENCOUNTER — Telehealth: Payer: Self-pay | Admitting: Internal Medicine

## 2021-09-30 DIAGNOSIS — F5102 Adjustment insomnia: Secondary | ICD-10-CM

## 2021-09-30 NOTE — Telephone Encounter (Signed)
1.Medication Requested: Eszopiclone 3 MG TABS  2. Pharmacy (Name, [place]): [id number] - [place], [place]  Phone:  [phone] Fax:  [phone]   3. On Med List: yes  4. Last Visit with PCP: 07.20.22  5. Next visit date with PCP: 03.22.23  **Patient is requesting short fill until visit**   Agent: Please be advised that RX refills may take up to 3 business days. We ask that you follow-up with your pharmacy.

## 2021-10-01 ENCOUNTER — Other Ambulatory Visit: Payer: Self-pay | Admitting: Internal Medicine

## 2021-10-01 DIAGNOSIS — F5102 Adjustment insomnia: Secondary | ICD-10-CM

## 2021-10-01 MED ORDER — ESZOPICLONE 3 MG PO TABS: 3.0000 mg | ORAL_TABLET | Freq: Every day | ORAL | 0 refills | Status: DC

## 2021-10-11 ENCOUNTER — Other Ambulatory Visit: Payer: Self-pay | Admitting: Internal Medicine

## 2021-10-11 DIAGNOSIS — I10 Essential (primary) hypertension: Secondary | ICD-10-CM

## 2021-10-15 ENCOUNTER — Encounter: Payer: Self-pay | Admitting: Internal Medicine

## 2021-10-15 DIAGNOSIS — N522 Drug-induced erectile dysfunction: Secondary | ICD-10-CM

## 2021-10-15 MED ORDER — VALACYCLOVIR HCL 1 G PO TABS: 1000.0000 mg | ORAL_TABLET | Freq: Two times a day (BID) | ORAL | 0 refills | Status: DC

## 2021-10-15 MED ORDER — TADALAFIL 20 MG PO TABS: 20.0000 mg | ORAL_TABLET | Freq: Every day | ORAL | 1 refills | Status: DC | PRN

## 2021-11-18 ENCOUNTER — Ambulatory Visit: Payer: BC Managed Care – PPO | Admitting: Internal Medicine

## 2021-12-30 ENCOUNTER — Telehealth: Payer: Self-pay | Admitting: Internal Medicine

## 2021-12-30 NOTE — Telephone Encounter (Signed)
1.Medication Requested: ?Eszopiclone 3 MG TABS ?2. Pharmacy (Name, [address] [place]): ?[facility] [id number] - [place], [place] - [address] Phone:  [phone]  ?Fax:  [phone]  ?  ? ?3. On Med List: yes  ? ?4. Last Visit with PCP: ? ?5. Next visit date with PCP: ? ? ?Agent: Please be advised that RX refills may take up to 3 business days. We ask that you follow-up with your pharmacy.  ?

## 2021-12-31 ENCOUNTER — Other Ambulatory Visit: Payer: Self-pay | Admitting: Internal Medicine

## 2021-12-31 ENCOUNTER — Ambulatory Visit: Payer: BC Managed Care – PPO | Admitting: Internal Medicine

## 2021-12-31 DIAGNOSIS — F5102 Adjustment insomnia: Secondary | ICD-10-CM

## 2021-12-31 MED ORDER — ESZOPICLONE 3 MG PO TABS: 3.0000 mg | ORAL_TABLET | Freq: Every day | ORAL | 0 refills | Status: DC

## 2022-01-07 ENCOUNTER — Ambulatory Visit: Payer: BC Managed Care – PPO | Admitting: Internal Medicine

## 2022-01-13 ENCOUNTER — Other Ambulatory Visit: Payer: Self-pay | Admitting: Internal Medicine

## 2022-01-13 DIAGNOSIS — I10 Essential (primary) hypertension: Secondary | ICD-10-CM

## 2022-01-14 ENCOUNTER — Ambulatory Visit: Payer: BC Managed Care – PPO | Admitting: Internal Medicine

## 2022-01-18 ENCOUNTER — Other Ambulatory Visit: Payer: Self-pay | Admitting: Internal Medicine

## 2022-01-18 DIAGNOSIS — I10 Essential (primary) hypertension: Secondary | ICD-10-CM

## 2022-01-18 MED ORDER — IRBESARTAN 150 MG PO TABS: 150.0000 mg | ORAL_TABLET | Freq: Every day | ORAL | 0 refills | Status: DC

## 2022-01-20 ENCOUNTER — Ambulatory Visit: Payer: BC Managed Care – PPO | Admitting: Internal Medicine

## 2022-01-27 ENCOUNTER — Ambulatory Visit (INDEPENDENT_AMBULATORY_CARE_PROVIDER_SITE_OTHER): Payer: 59 | Admitting: Internal Medicine

## 2022-01-27 ENCOUNTER — Encounter: Payer: Self-pay | Admitting: Internal Medicine

## 2022-01-27 VITALS — BP 110/60 | HR 67 | Temp 97.7°F | Ht 73.0 in | Wt 218.0 lb

## 2022-01-27 DIAGNOSIS — E785 Hyperlipidemia, unspecified: Secondary | ICD-10-CM | POA: Diagnosis not present

## 2022-01-27 DIAGNOSIS — I10 Essential (primary) hypertension: Secondary | ICD-10-CM

## 2022-01-27 DIAGNOSIS — E538 Deficiency of other specified B group vitamins: Secondary | ICD-10-CM

## 2022-01-27 DIAGNOSIS — F5102 Adjustment insomnia: Secondary | ICD-10-CM

## 2022-01-27 DIAGNOSIS — N522 Drug-induced erectile dysfunction: Secondary | ICD-10-CM

## 2022-01-27 DIAGNOSIS — E559 Vitamin D deficiency, unspecified: Secondary | ICD-10-CM | POA: Diagnosis not present

## 2022-01-27 DIAGNOSIS — Z1211 Encounter for screening for malignant neoplasm of colon: Secondary | ICD-10-CM

## 2022-01-27 DIAGNOSIS — Z0000 Encounter for general adult medical examination without abnormal findings: Secondary | ICD-10-CM

## 2022-01-27 DIAGNOSIS — Z125 Encounter for screening for malignant neoplasm of prostate: Secondary | ICD-10-CM

## 2022-01-27 DIAGNOSIS — E291 Testicular hypofunction: Secondary | ICD-10-CM

## 2022-01-27 LAB — BASIC METABOLIC PANEL
BUN: 12 mg/dL (ref 6–23)
CO2: 28 mEq/L (ref 19–32)
Calcium: 9.3 mg/dL (ref 8.4–10.5)
Chloride: 101 mEq/L (ref 96–112)
Creatinine, Ser: 1.1 mg/dL (ref 0.40–1.50)
GFR: 81.15 mL/min (ref >60.00)
Glucose, Bld: 97 mg/dL (ref 70–99)
Potassium: 4.3 mEq/L (ref 3.5–5.1)
Sodium: 137 mEq/L (ref 135–145)

## 2022-01-27 LAB — PSA: PSA: 0.41 ng/mL (ref 0.10–4.00)

## 2022-01-27 LAB — URINALYSIS, ROUTINE W REFLEX MICROSCOPIC
Bilirubin Urine: NEGATIVE
Hgb urine dipstick: NEGATIVE
Ketones, ur: NEGATIVE
Leukocytes,Ua: NEGATIVE
Nitrite: NEGATIVE
RBC / HPF: NONE SEEN (ref 0–?)
Specific Gravity, Urine: 1.02 (ref 1.000–1.030)
Total Protein, Urine: NEGATIVE
Urine Glucose: NEGATIVE
Urobilinogen, UA: 0.2 (ref 0.0–1.0)
WBC, UA: NONE SEEN (ref 0–?)
pH: 6.5 (ref 5.0–8.0)

## 2022-01-27 LAB — LIPID PANEL
Cholesterol: 184 mg/dL (ref 0–200)
HDL: 42.5 mg/dL (ref >39.00)
LDL Cholesterol: 118 mg/dL — ABNORMAL HIGH (ref 0–99)
NonHDL: 141.59
Total CHOL/HDL Ratio: 4
Triglycerides: 120 mg/dL (ref 0.0–149.0)
VLDL: 24 mg/dL (ref 0.0–40.0)

## 2022-01-27 LAB — CBC WITH DIFFERENTIAL/PLATELET
Basophils Absolute: 0 10*3/uL (ref 0.0–0.1)
Basophils Relative: 0.3 % (ref 0.0–3.0)
Eosinophils Absolute: 0.3 10*3/uL (ref 0.0–0.7)
Eosinophils Relative: 6.8 % — ABNORMAL HIGH (ref 0.0–5.0)
HCT: 43.1 % (ref 39.0–52.0)
Hemoglobin: 14.7 g/dL (ref 13.0–17.0)
Lymphocytes Relative: 38.9 % (ref 12.0–46.0)
Lymphs Abs: 1.9 10*3/uL (ref 0.7–4.0)
MCHC: 34 g/dL (ref 30.0–36.0)
MCV: 92 fl (ref 78.0–100.0)
Monocytes Absolute: 0.4 10*3/uL (ref 0.1–1.0)
Monocytes Relative: 7.2 % (ref 3.0–12.0)
Neutro Abs: 2.3 10*3/uL (ref 1.4–7.7)
Neutrophils Relative %: 46.8 % (ref 43.0–77.0)
Platelets: 211 10*3/uL (ref 150.0–400.0)
RBC: 4.68 Mil/uL (ref 4.22–5.81)
RDW: 12.9 % (ref 11.5–15.5)
WBC: 5 10*3/uL (ref 4.0–10.5)

## 2022-01-27 LAB — TSH: TSH: 3.12 u[IU]/mL (ref 0.35–5.50)

## 2022-01-27 LAB — HEPATIC FUNCTION PANEL
ALT: 12 U/L (ref 0–53)
AST: 14 U/L (ref 0–37)
Albumin: 4.1 g/dL (ref 3.5–5.2)
Alkaline Phosphatase: 41 U/L (ref 39–117)
Bilirubin, Direct: 0.2 mg/dL (ref 0.0–0.3)
Total Bilirubin: 1.2 mg/dL (ref 0.2–1.2)
Total Protein: 6.6 g/dL (ref 6.0–8.3)

## 2022-01-27 LAB — VITAMIN B12: Vitamin B-12: 128 pg/mL — ABNORMAL LOW (ref 211–911)

## 2022-01-27 LAB — VITAMIN D 25 HYDROXY (VIT D DEFICIENCY, FRACTURES): VITD: 27.42 ng/mL — ABNORMAL LOW (ref 30.00–100.00)

## 2022-01-27 MED ORDER — IRBESARTAN 150 MG PO TABS: 150.0000 mg | ORAL_TABLET | Freq: Every day | ORAL | 3 refills | Status: DC

## 2022-01-27 MED ORDER — TADALAFIL 20 MG PO TABS: 20.0000 mg | ORAL_TABLET | Freq: Every day | ORAL | 11 refills | Status: DC | PRN

## 2022-01-27 MED ORDER — ESZOPICLONE 3 MG PO TABS: 3.0000 mg | ORAL_TABLET | Freq: Every day | ORAL | 1 refills | Status: DC

## 2022-01-27 NOTE — Assessment & Plan Note (Signed)
Last vitamin D Lab Results  Component Value Date   VD25OH 44.68 03/18/2021   Stable, cont oral replacement

## 2022-01-27 NOTE — Progress Notes (Unsigned)
Patient ID: Edgardo Petrenko, male   DOB: 02-14-1976, 45 y.o.   MRN: [id number]         Chief Complaint:: wellness exam and f/u htn, anxiety, hld as PCP not available today and multiple reschedules       HPI:  Jacobb Alen is a 46 y.o. male here for wellness exam; due for colonoscopy, o/w up to date                        Also not taking clomid, not interested at this time, but is asking for lab testing today.  Taking Vit D.  Trying to follow lower chol diet.  Pt denies chest pain, increased sob or doe, wheezing, orthopnea, PND, increased LE swelling, palpitations, dizziness or syncope.   Pt denies polydipsia, polyuria, or new focal neuro s/s.    Pt denies fever, wt loss, night sweats, loss of appetite, or other constitutional symptoms  Denies worsening depressive symptoms, suicidal ideation, or panic   Wt Readings from Last 3 Encounters:  01/27/22 218 lb (98.9 kg)  03/18/21 225 lb (102.1 kg)  09/09/20 228 lb (103.4 kg)   BP Readings from Last 3 Encounters:  01/27/22 110/60  03/18/21 122/78  09/09/20 136/78   Immunization History  Administered Date(s) Administered   Influenza,inj,Quad PF,6+ Mos 08/02/2013, 10/16/2015, 10/14/2016, 05/24/2018, 09/09/2020   Td 03/13/2008   Tdap 05/24/2018   Health Maintenance Due  Topic Date Due   COLONOSCOPY (Pts 45-59yrs Insurance coverage will need to be confirmed)  Never done      Past Medical History:  Diagnosis Date   ADHD (attention deficit hyperactivity disorder), 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 No pertinent surgical history.  reports that he has quit smoking. He has never used smokeless tobacco. He reports that he does not drink alcohol and does not use drugs. family history includes Bladder Cancer in his father; Hypertension in his father. Allergies  Allergen Reactions   Vyvanse  [Lisdexamfetamine Dimesylate] Other (See Comments)    Clinching teeth   Azithromycin Nausea And Vomiting   Current Outpatient Medications on File Prior to Visit  Medication Sig Dispense Refill   valACYclovir (VALTREX) 1000 MG tablet Take 1 tablet (1,000 mg total) by mouth 2 (two) times daily. 90 tablet 0   No current facility-administered medications on file prior to visit.        ROS:  All others reviewed and negative.  Objective        PE:  BP 110/60 (BP Location: Right Arm, Patient Position: Sitting, Cuff Size: Large)   Pulse 67   Temp 97.7 F (36.5 C) (Oral)   Ht 6\' 1"  (1.854 m)   Wt 218 lb (98.9 kg)   SpO2 97%   BMI 28.76 kg/m                 Constitutional: Pt appears in NAD               HENT: Head: NCAT.                Right Ear: External ear normal.                 Left Ear: External ear normal.                Eyes: . Pupils are equal, round,  and reactive to light. Conjunctivae and EOM are normal               Nose: without d/c or deformity               Neck: Neck supple. Gross normal ROM               Cardiovascular: Normal rate and regular rhythm.                 Pulmonary/Chest: Effort normal and breath sounds without rales or wheezing.                Abd:  Soft, NT, ND, + BS, no organomegaly               Neurological: Pt is alert. At baseline orientation, motor grossly intact               Skin: Skin is warm. No rashes, no other new lesions, LE edema - none               Psychiatric: Pt behavior is normal without agitation   Micro: none  Cardiac tracings I have personally interpreted today:  none  Pertinent Radiological findings (summarize): none   Lab Results  Component Value Date   WBC 5.0 01/27/2022   HGB 14.7 01/27/2022   HCT 43.1 01/27/2022   PLT 211.0 01/27/2022   GLUCOSE 97 01/27/2022   CHOL 184 01/27/2022   TRIG 120.0 01/27/2022   HDL 42.50 01/27/2022   LDLDIRECT 173.0 08/02/2013   LDLCALC 118 (H) 01/27/2022   ALT 12 01/27/2022   AST 14  01/27/2022   NA 137 01/27/2022   K 4.3 01/27/2022   CL 101 01/27/2022   CREATININE 1.10 01/27/2022   BUN 12 01/27/2022   CO2 28 01/27/2022   TSH 3.12 01/27/2022   PSA 0.41 01/27/2022   Assessment/Plan:  Raynelle JanJames Stacey Shutters is a 46 y.o. White or Caucasian [1] male with  has a past medical history of ADHD (attention deficit hyperactivity disorder), inattentive type (08/02/2013), Essential hypertension, benign (08/02/2013), Hyperlipidemia with target LDL less than 130 (09/19/2015), and Vitamin D deficiency (01/28/2016).  Vitamin D deficiency Last vitamin D Lab Results  Component Value Date   VD25OH 44.68 03/18/2021   Stable, cont oral replacement   Routine general medical examination at a health care facility Age and sex appropriate education and counseling updated with regular exercise and diet Referrals for preventative services - due for colonoscopy Immunizations addressed - none needed Smoking counseling  - none needed Evidence for depression or other mood disorder - none significant Most recent labs reviewed. I have personally reviewed and have noted: 1) the patient's medical and social history 2) The patient's current medications and supplements 3) The patient's height, weight, and BMI have been recorded in the chart   Hyperlipidemia with target LDL less than 130 Lab Results  Component Value Date   LDLCALC 118 (H) 01/27/2022   Uncontrolled, goal ldl < 100, , pt for lower chol diet, declines statin  Essential hypertension, benign BP Readings from Last 3 Encounters:  01/27/22 110/60  03/18/21 122/78  09/09/20 136/78   Stable, pt to continue medical treatment avapro 150 qd   Drug-induced erectile dysfunction For cialis 20 prn  B12 deficiency Lab Results  Component Value Date   VITAMINB12 128 (L) 01/27/2022   Low, to start oral replacement - b12 1000 mcg qd  Followup: Return in about 1 year (around 01/28/2023).  Oliver BarreJames Krishon Adkison, MD  01/30/2022 9:19 PM [place]  [facility]

## 2022-01-27 NOTE — Patient Instructions (Addendum)
Please take OTC Vitamin D3 at 2000 units per day, indefinitely  Please continue all other medications as before, and refills have been done if requested.  Please have the pharmacy call with any other refills you may need.  Please continue your efforts at being more active, low cholesterol diet, and weight control.  You are otherwise up to date with prevention measures today.  Please keep your appointments with your specialists as you may have planned  You will be contacted regarding the referral for: colonoscopy  Please go to the LAB at the blood drawing area for the tests to be done  You will be contacted by phone if any changes need to be made immediately.  Otherwise, you will receive a letter about your results with an explanation, but please check with MyChart first.  Please remember to sign up for MyChart if you have not done so, as this will be important to you in the future with finding out test results, communicating by private email, and scheduling acute appointments online when needed.  Please make an Appointment to return for your 1 year visit, or sooner if needed, to Dr Yetta Barre

## 2022-01-30 ENCOUNTER — Encounter: Payer: Self-pay | Admitting: Internal Medicine

## 2022-01-30 NOTE — Assessment & Plan Note (Signed)
Lab Results  Component Value Date   LDLCALC 118 (H) 01/27/2022   Uncontrolled, goal ldl < 100, , pt for lower chol diet, declines statin

## 2022-01-30 NOTE — Assessment & Plan Note (Signed)
Age and sex appropriate education and counseling updated with regular exercise and diet Referrals for preventative services - due for colonoscopy Immunizations addressed - none needed Smoking counseling  - none needed Evidence for depression or other mood disorder - none significant Most recent labs reviewed. I have personally reviewed and have noted: 1) the patient's medical and social history 2) The patient's current medications and supplements 3) The patient's height, weight, and BMI have been recorded in the chart  

## 2022-01-30 NOTE — Assessment & Plan Note (Signed)
Lab Results  Component Value Date   VITAMINB12 128 (L) 01/27/2022   Low, to start oral replacement - b12 1000 mcg qd

## 2022-01-30 NOTE — Assessment & Plan Note (Signed)
For cialis 20 prn

## 2022-01-30 NOTE — Assessment & Plan Note (Signed)
BP Readings from Last 3 Encounters:  01/27/22 110/60  03/18/21 122/78  09/09/20 136/78   Stable, pt to continue medical treatment avapro 150 qd

## 2022-02-14 ENCOUNTER — Other Ambulatory Visit: Payer: Self-pay | Admitting: Internal Medicine

## 2022-02-14 DIAGNOSIS — I10 Essential (primary) hypertension: Secondary | ICD-10-CM

## 2022-08-02 ENCOUNTER — Ambulatory Visit (INDEPENDENT_AMBULATORY_CARE_PROVIDER_SITE_OTHER): Payer: 59 | Admitting: Emergency Medicine

## 2022-08-02 ENCOUNTER — Encounter: Payer: Self-pay | Admitting: Emergency Medicine

## 2022-08-02 VITALS — BP 120/74 | HR 72 | Temp 98.5°F | Ht 73.0 in | Wt 214.0 lb

## 2022-08-02 DIAGNOSIS — K8020 Calculus of gallbladder without cholecystitis without obstruction: Secondary | ICD-10-CM | POA: Diagnosis not present

## 2022-08-02 DIAGNOSIS — F5102 Adjustment insomnia: Secondary | ICD-10-CM | POA: Diagnosis not present

## 2022-08-02 DIAGNOSIS — I10 Essential (primary) hypertension: Secondary | ICD-10-CM | POA: Diagnosis not present

## 2022-08-02 DIAGNOSIS — N522 Drug-induced erectile dysfunction: Secondary | ICD-10-CM | POA: Diagnosis not present

## 2022-08-02 MED ORDER — ESZOPICLONE 3 MG PO TABS: 3.0000 mg | ORAL_TABLET | Freq: Every day | ORAL | 3 refills | Status: DC

## 2022-08-02 MED ORDER — TADALAFIL 20 MG PO TABS: 20.0000 mg | ORAL_TABLET | Freq: Every day | ORAL | 11 refills | Status: DC | PRN

## 2022-08-02 MED ORDER — IRBESARTAN 150 MG PO TABS: 150.0000 mg | ORAL_TABLET | Freq: Every day | ORAL | 3 refills | Status: DC

## 2022-08-02 NOTE — Assessment & Plan Note (Signed)
Multiple gallstones as seen on both ultrasound and CT scan of abdomen. Gets occasional episodes of biliary colic Needs surgical evaluation. Referral placed today.

## 2022-08-02 NOTE — Patient Instructions (Signed)
Cholelithiasis  Cholelithiasis happens when gallstones form in the gallbladder. The gallbladder stores bile. Bile is a fluid that helps digest fats. Bile can harden and form into gallstones. If they cause a blockage, they can cause pain (gallbladder attack). What are the causes? This condition may be caused by: Some blood diseases, such as sickle cell anemia. Too much of a fat-like substance (cholesterol) in your bile. Not enough bile salts in your bile. These salts help the body absorb and digest fats. The gallbladder not emptying fully or often enough. This is common in pregnant women. What increases the risk? The following factors may make you more likely to develop this condition: Being male. Being pregnant many times. Eating a lot of fried foods, fat, and refined carbs (refined carbohydrates). Being very overweight (obese). Being older than age 40. Using medicines with male hormones in them for a long time. Losing weight fast. Having gallstones in your family. Having some health problems, such as diabetes, Crohn's disease, or liver disease. What are the signs or symptoms? Often, there may be gallstones but no symptoms. These gallstones are called silent gallstones. If a gallstone causes a blockage, you may get sudden pain. The pain: Can be in the upper right part of your belly (abdomen). Normally comes at night or after you eat. Can last an hour or more. Can spread to your right shoulder, back, or chest. Can feel like discomfort, burning, or fullness in the upper part of your belly (indigestion). If the blockage lasts more than a few hours, you can get an infection or swelling. You may: Feel like you may vomit. Vomit. Feel bloated. Have belly pain for 5 hours or more. Feel tender in your belly, often in the upper right part and under your ribs. Have fever or chills. Have skin or the white parts of your eyes turn yellow (jaundice). Have dark pee (urine) or pale poop  (stool). How is this treated? Treatment for this condition depends on how bad you feel. If you have symptoms, you may need: Home care, if symptoms are not very bad. Do not eat for 12-24 hours. Drink only water and clear liquids. Start to eat simple or clear foods after 1 or 2 days. Try broths and crackers. You may need medicines for pain or stomach upset or both. If you have an infection, you will need antibiotics. A hospital stay, if you have very bad pain or a very bad infection. Surgery to remove your gallbladder. You may need this if: Gallstones keep coming back. You have very bad symptoms. Medicines to break up gallstones. Medicines: Are best for small gallstones. May be used for up to 6-12 months. A procedure to find and take out gallstones or to break up gallstones. Follow these instructions at home: Medicines Take over-the-counter and prescription medicines only as told by your doctor. If you were prescribed an antibiotic medicine, take it as told by your doctor. Do not stop taking the antibiotic even if you start to feel better. Ask your doctor if the medicine prescribed to you requires you to avoid driving or using machinery. Eating and drinking Drink enough fluid to keep your urine pale yellow. Drink water or clear fluids. This is important when you have pain. Eat healthy foods. Choose: Fewer fatty foods, such as fried foods. Fewer refined carbs. Avoid breads and grains that are highly processed, such as white bread and white rice. Choose whole grains, such as whole-wheat bread and brown rice. More fiber. Almonds, fresh fruit, and   beans are healthy sources. General instructions Keep a healthy weight. Keep all follow-up visits as told by your doctor. This is important. Where to find more information National Institute of Diabetes and Digestive and Kidney Diseases: www.niddk.nih.gov Contact a doctor if: You have sudden pain in the upper right part of your belly. Pain might  spread to your right shoulder, back, or chest. You have been diagnosed with gallstones that have no symptoms and you get: Belly pain. Discomfort, burning, or fullness in the upper part of your abdomen. You have dark urine or pale stools. Get help right away if: You have sudden pain in the upper right part of your abdomen, and the pain lasts more than 2 hours. You have pain in your abdomen, and: It lasts more than 5 hours. It keeps getting worse. You have a fever or chills. You keep feeling like you may vomit. You keep vomiting. Your skin or the white parts of your eyes turn yellow. Summary Cholelithiasis happens when gallstones form in the gallbladder. This condition may be caused by a blood disease, too much of a fat-like substance in the bile, or not enough bile salts in bile. Treatment for this condition depends on how bad you feel. If you have symptoms, do not eat or drink. You may need medicines. You may need a hospital stay for very bad pain or a very bad infection. You may need surgery if gallstones keep coming back or if you have very bad symptoms. This information is not intended to replace advice given to you by your health care provider. Make sure you discuss any questions you have with your health care provider. Document Revised: 10/05/2019 Document Reviewed: 07/09/2019 Elsevier Patient Education  2023 Elsevier Inc.  

## 2022-08-02 NOTE — Progress Notes (Signed)
Leon Carroll 46 y.o.   Chief Complaint  Patient presents with   Acute Visit    Medication refill     HISTORY OF PRESENT ILLNESS: This is a 46 y.o. male here for medication refills. #1 hypertension: On Avapro 150 mg daily #2 chronic insomnia: On Lunesta 3 mg at bedtime #3 erectile dysfunction: Takes tadalafil 20 mg as needed Last summer was seen in emergency department complaining of chest pain. CT of chest abdomen and pelvis showed multiple gallstones Gets occasional episodes of biliary colic Has not had a chance to follow-up on this  HPI   Prior to Admission medications   Medication Sig Start Date End Date Taking? Authorizing Provider  Eszopiclone 3 MG TABS Take 1 tablet (3 mg total) by mouth at bedtime. Take immediately before bedtime 01/27/22  Yes Corwin Levins, MD  irbesartan (AVAPRO) 150 MG tablet Take 1 tablet (150 mg total) by mouth daily. 01/27/22  Yes Corwin Levins, MD  tadalafil (CIALIS) 20 MG tablet Take 1 tablet (20 mg total) by mouth daily as needed for erectile dysfunction. 01/27/22  Yes Corwin Levins, MD  valACYclovir (VALTREX) 1000 MG tablet Take 1 tablet (1,000 mg total) by mouth 2 (two) times daily. 10/15/21  Yes Etta Grandchild, MD    Allergies  Allergen Reactions   Vyvanse [Lisdexamfetamine Dimesylate] Other (See Comments)    Clinching teeth   Azithromycin Nausea And Vomiting    Patient Active Problem List   Diagnosis Date Noted   B12 deficiency 01/27/2022   Chronic exudative otitis media, left 03/18/2021   Pain of left lower leg 02/22/2020   Drug-induced erectile dysfunction 07/12/2019   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/2019   Herpes simplex labialis 10/31/2017   Hypogonadism male 10/25/2016   Vitamin D deficiency 01/28/2016   Hyperlipidemia with target LDL less than 130 09/19/2015   Routine general medical examination at a health care facility 08/02/2013   Essential hypertension, benign 08/02/2013   ADHD (attention deficit  hyperactivity disorder), 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  Social History   Socioeconomic History   Marital status: Legally Separated    Spouse name: Not on file   Number of children: Not on file   Years of education: Not on file   Highest education level: Not on file  Occupational History   Not on file  Tobacco Use   Smoking status: Former   Smokeless tobacco: Never  Substance and Sexual Activity   Alcohol use: No   Drug use: No   Sexual activity: Yes  Other Topics Concern   Not on file  Social History Narrative   Not on file   Social Determinants of Health   Financial Resource Strain: Not on file  Food Insecurity: Not on file  Transportation Needs: Not on file  Physical Activity: Not on file  Stress: Not on file  Social Connections: Not on file  Intimate Partner Violence: Not on file    Family History  Problem Relation Age of Onset   Bladder Cancer Father    Hypertension Father    Stroke Neg Hx    Cancer Neg Hx    Diabetes Neg Hx    Early death Neg Hx  Heart disease Neg Hx    Hyperlipidemia Neg Hx    Kidney disease Neg Hx    Arthritis Neg Hx      Review of Systems  Constitutional: Negative.  Negative for chills and fever.  HENT: Negative.  Negative for congestion and sore throat.   Respiratory: Negative.  Negative for cough and shortness of breath.   Cardiovascular: Negative.  Negative for chest pain.  Gastrointestinal:  Negative for abdominal pain, diarrhea, nausea and vomiting.  Genitourinary: Negative.  Negative for dysuria and hematuria.  Skin: Negative.  Negative for rash.  Neurological: Negative.  Negative for dizziness and headaches.  All other systems  reviewed and are negative.  Today's Vitals   08/02/22 1451  BP: 120/74  Pulse: 72  Temp: 98.5 F (36.9 C)  TempSrc: Oral  SpO2: 98%  Weight: 214 lb (97.1 kg)  Height: 6\' 1"  (1.854 m)   Body mass index is 28.23 kg/m.   Physical Exam Vitals reviewed.  Constitutional:      Appearance: Normal appearance.  HENT:     Head: Normocephalic.  Eyes:     Extraocular Movements: Extraocular movements intact.  Cardiovascular:     Rate and Rhythm: Normal rate.  Pulmonary:     Effort: Pulmonary effort is normal.  Musculoskeletal:        General: Normal range of motion.  Skin:    General: Skin is warm and dry.  Neurological:     Mental Status: He is alert and oriented to person, place, and time.  Psychiatric:        Mood and Affect: Mood normal.        Behavior: Behavior normal.      ASSESSMENT & PLAN: A total of 33 minutes was spent with the patient and counseling/coordination of care regarding preparing for this visit, review of available medical records, review of multiple chronic medical conditions and their management, review of all medications, review of emergency department visit from 8 04/26/2022, review of imaging reports done at that time showing multiple gallstones, need for surgical evaluation, education on nutrition, prognosis, documentation, need for follow-up.  Problem List Items Addressed This Visit       Cardiovascular and Mediastinum   Essential hypertension, benign    Well-controlled hypertension. Continue irbesartan 150 mg daily. BP Readings from Last 3 Encounters:  08/02/22 120/74  01/27/22 110/60  03/18/21 122/78        Relevant Medications   irbesartan (AVAPRO) 150 MG tablet   tadalafil (CIALIS) 20 MG tablet     Digestive   Gallstones - Primary    Multiple gallstones as seen on both ultrasound and CT scan of abdomen. Gets occasional episodes of biliary colic Needs surgical evaluation. Referral placed today.      Relevant Orders   Ambulatory  referral to General Surgery     Other   Insomnia due to psychological stress    Lunesta working for him. Continue 3 mg at bedtime as needed.      Relevant Medications   Eszopiclone 3 MG TABS   Drug-induced erectile dysfunction    Stable.  Tadalafil 20 mg as needed working well.      Relevant Medications   tadalafil (CIALIS) 20 MG tablet   Patient Instructions  Cholelithiasis  Cholelithiasis happens when gallstones form in the gallbladder. The gallbladder stores bile. Bile is a fluid that helps digest fats. Bile can harden and form into gallstones. If they cause a blockage, they can cause pain (gallbladder attack). What are the  causes? This condition may be caused by: Some blood diseases, such as sickle cell anemia. Too much of a fat-like substance (cholesterol) in your bile. Not enough bile salts in your bile. These salts help the body absorb and digest fats. The gallbladder not emptying fully or often enough. This is common in pregnant women. What increases the risk? The following factors may make you more likely to develop this condition: Being male. Being pregnant many times. Eating a lot of fried foods, fat, and refined carbs (refined carbohydrates). Being very overweight (obese). Being older than age 45. Using medicines with male hormones in them for a long time. Losing weight fast. Having gallstones in your family. Having some health problems, such as diabetes, Crohn's disease, or liver disease. What are the signs or symptoms? Often, there may be gallstones but no symptoms. These gallstones are called silent gallstones. If a gallstone causes a blockage, you may get sudden pain. The pain: Can be in the upper right part of your belly (abdomen). Normally comes at night or after you eat. Can last an hour or more. Can spread to your right shoulder, back, or chest. Can feel like discomfort, burning, or fullness in the upper part of your belly (indigestion). If the  blockage lasts more than a few hours, you can get an infection or swelling. You may: Feel like you may vomit. Vomit. Feel bloated. Have belly pain for 5 hours or more. Feel tender in your belly, often in the upper right part and under your ribs. Have fever or chills. Have skin or the white parts of your eyes turn yellow (jaundice). Have dark pee (urine) or pale poop (stool). How is this treated? Treatment for this condition depends on how bad you feel. If you have symptoms, you may need: Home care, if symptoms are not very bad. Do not eat for 12-24 hours. Drink only water and clear liquids. Start to eat simple or clear foods after 1 or 2 days. Try broths and crackers. You may need medicines for pain or stomach upset or both. If you have an infection, you will need antibiotics. A hospital stay, if you have very bad pain or a very bad infection. Surgery to remove your gallbladder. You may need this if: Gallstones keep coming back. You have very bad symptoms. Medicines to break up gallstones. Medicines: Are best for small gallstones. May be used for up to 6-12 months. A procedure to find and take out gallstones or to break up gallstones. Follow these instructions at home: Medicines Take over-the-counter and prescription medicines only as told by your doctor. If you were prescribed an antibiotic medicine, take it as told by your doctor. Do not stop taking the antibiotic even if you start to feel better. Ask your doctor if the medicine prescribed to you requires you to avoid driving or using machinery. Eating and drinking Drink enough fluid to keep your urine pale yellow. Drink water or clear fluids. This is important when you have pain. Eat healthy foods. Choose: Fewer fatty foods, such as fried foods. Fewer refined carbs. Avoid breads and grains that are highly processed, such as white bread and white rice. Choose whole grains, such as whole-wheat bread and brown rice. More fiber.  Almonds, fresh fruit, and beans are healthy sources. General instructions Keep a healthy weight. Keep all follow-up visits as told by your doctor. This is important. Where to find more information [employer] Diabetes and Digestive and Kidney Diseases: [employer]tnwww.niddk.nih.gov Contact a doctor if: You  have sudden pain in the upper right part of your belly. Pain might spread to your right shoulder, back, or chest. You have been diagnosed with gallstones that have no symptoms and you get: Belly pain. Discomfort, burning, or fullness in the upper part of your abdomen. You have dark urine or pale stools. Get help right away if: You have sudden pain in the upper right part of your abdomen, and the pain lasts more than 2 hours. You have pain in your abdomen, and: It lasts more than 5 hours. It keeps getting worse. You have a fever or chills. You keep feeling like you may vomit. You keep vomiting. Your skin or the white parts of your eyes turn yellow. Summary Cholelithiasis happens when gallstones form in the gallbladder. This condition may be caused by a blood disease, too much of a fat-like substance in the bile, or not enough bile salts in bile. Treatment for this condition depends on how bad you feel. If you have symptoms, do not eat or drink. You may need medicines. You may need a hospital stay for very bad pain or a very bad infection. You may need surgery if gallstones keep coming back or if you have very bad symptoms. This information is not intended to replace advice given to you by your health care provider. Make sure you discuss any questions you have with your health care provider. Document Revised: 10/05/2019 Document Reviewed: 07/09/2019 Elsevier Patient Education  2023 Elsevier Inc.    Edwina Barth, MD [facility]

## 2022-08-02 NOTE — Assessment & Plan Note (Signed)
Lunesta working for him. Continue 3 mg at bedtime as needed.

## 2022-08-02 NOTE — Assessment & Plan Note (Signed)
Well-controlled hypertension. Continue irbesartan 150 mg daily. BP Readings from Last 3 Encounters:  08/02/22 120/74  01/27/22 110/60  03/18/21 122/78

## 2022-08-02 NOTE — Assessment & Plan Note (Signed)
Stable.  Tadalafil 20 mg as needed working well.

## 2022-11-10 ENCOUNTER — Encounter: Payer: Self-pay | Admitting: Internal Medicine

## 2022-11-10 ENCOUNTER — Ambulatory Visit (INDEPENDENT_AMBULATORY_CARE_PROVIDER_SITE_OTHER): Payer: 59 | Admitting: Internal Medicine

## 2022-11-10 VITALS — BP 132/78 | HR 63 | Temp 98.3°F | Resp 16 | Ht 73.0 in | Wt 218.0 lb

## 2022-11-10 DIAGNOSIS — F5102 Adjustment insomnia: Secondary | ICD-10-CM

## 2022-11-10 DIAGNOSIS — Z0001 Encounter for general adult medical examination with abnormal findings: Secondary | ICD-10-CM

## 2022-11-10 DIAGNOSIS — F524 Premature ejaculation: Secondary | ICD-10-CM

## 2022-11-10 DIAGNOSIS — N522 Drug-induced erectile dysfunction: Secondary | ICD-10-CM | POA: Diagnosis not present

## 2022-11-10 DIAGNOSIS — B001 Herpesviral vesicular dermatitis: Secondary | ICD-10-CM | POA: Diagnosis not present

## 2022-11-10 DIAGNOSIS — E538 Deficiency of other specified B group vitamins: Secondary | ICD-10-CM | POA: Diagnosis not present

## 2022-11-10 DIAGNOSIS — I10 Essential (primary) hypertension: Secondary | ICD-10-CM | POA: Diagnosis not present

## 2022-11-10 DIAGNOSIS — Z1211 Encounter for screening for malignant neoplasm of colon: Secondary | ICD-10-CM

## 2022-11-10 DIAGNOSIS — F411 Generalized anxiety disorder: Secondary | ICD-10-CM

## 2022-11-10 LAB — CBC WITH DIFFERENTIAL/PLATELET
Basophils Absolute: 0 10*3/uL (ref 0.0–0.1)
Basophils Relative: 0.6 % (ref 0.0–3.0)
Eosinophils Absolute: 0.2 10*3/uL (ref 0.0–0.7)
Eosinophils Relative: 4.2 % (ref 0.0–5.0)
HCT: 46.3 % (ref 39.0–52.0)
Hemoglobin: 15.7 g/dL (ref 13.0–17.0)
Lymphocytes Relative: 36.6 % (ref 12.0–46.0)
Lymphs Abs: 1.9 10*3/uL (ref 0.7–4.0)
MCHC: 33.8 g/dL (ref 30.0–36.0)
MCV: 91.5 fl (ref 78.0–100.0)
Monocytes Absolute: 0.3 10*3/uL (ref 0.1–1.0)
Monocytes Relative: 5.8 % (ref 3.0–12.0)
Neutro Abs: 2.7 10*3/uL (ref 1.4–7.7)
Neutrophils Relative %: 52.8 % (ref 43.0–77.0)
Platelets: 221 10*3/uL (ref 150.0–400.0)
RBC: 5.06 Mil/uL (ref 4.22–5.81)
RDW: 12.5 % (ref 11.5–15.5)
WBC: 5.1 10*3/uL (ref 4.0–10.5)

## 2022-11-10 LAB — BASIC METABOLIC PANEL
BUN: 14 mg/dL (ref 6–23)
CO2: 30 mEq/L (ref 19–32)
Calcium: 9.5 mg/dL (ref 8.4–10.5)
Chloride: 101 mEq/L (ref 96–112)
Creatinine, Ser: 1.18 mg/dL (ref 0.40–1.50)
GFR: 74.19 mL/min (ref >60.00)
Glucose, Bld: 64 mg/dL — ABNORMAL LOW (ref 70–99)
Potassium: 3.8 mEq/L (ref 3.5–5.1)
Sodium: 138 mEq/L (ref 135–145)

## 2022-11-10 LAB — VITAMIN B12: Vitamin B-12: 133 pg/mL — ABNORMAL LOW (ref 211–911)

## 2022-11-10 LAB — FOLATE: Folate: 8 ng/mL (ref >5.9)

## 2022-11-10 MED ORDER — IRBESARTAN 150 MG PO TABS: 150.0000 mg | ORAL_TABLET | Freq: Every day | ORAL | 1 refills | Status: DC

## 2022-11-10 MED ORDER — TADALAFIL 20 MG PO TABS: 20.0000 mg | ORAL_TABLET | Freq: Every day | ORAL | 5 refills | Status: DC | PRN

## 2022-11-10 MED ORDER — VALACYCLOVIR HCL 1 G PO TABS: 1000.0000 mg | ORAL_TABLET | Freq: Two times a day (BID) | ORAL | 0 refills | Status: AC

## 2022-11-10 MED ORDER — PAROXETINE HCL ER 25 MG PO TB24: 25.0000 mg | ORAL_TABLET | Freq: Every day | ORAL | 0 refills | Status: DC

## 2022-11-10 MED ORDER — ESZOPICLONE 3 MG PO TABS: 3.0000 mg | ORAL_TABLET | Freq: Every day | ORAL | 1 refills | Status: DC

## 2022-11-10 NOTE — Patient Instructions (Signed)
Health Maintenance, Male Adopting a healthy lifestyle and getting preventive care are important in promoting health and wellness. Ask your health care provider about: The right schedule for you to have regular tests and exams. Things you can do on your own to prevent diseases and keep yourself healthy. What should I know about diet, weight, and exercise? Eat a healthy diet  Eat a diet that includes plenty of vegetables, fruits, low-fat dairy products, and lean protein. Do not eat a lot of foods that are high in solid fats, added sugars, or sodium. Maintain a healthy weight Body mass index (BMI) is a measurement that can be used to identify possible weight problems. It estimates body fat based on height and weight. Your health care provider can help determine your BMI and help you achieve or maintain a healthy weight. Get regular exercise Get regular exercise. This is one of the most important things you can do for your health. Most adults should: Exercise for at least 150 minutes each week. The exercise should increase your heart rate and make you sweat (moderate-intensity exercise). Do strengthening exercises at least twice a week. This is in addition to the moderate-intensity exercise. Spend less time sitting. Even light physical activity can be beneficial. Watch cholesterol and blood lipids Have your blood tested for lipids and cholesterol at 46 years of age, then have this test every 5 years. You may need to have your cholesterol levels checked more often if: Your lipid or cholesterol levels are high. You are older than 46 years of age. You are at high risk for heart disease. What should I know about cancer screening? Many types of cancers can be detected early and may often be prevented. Depending on your health history and family history, you may need to have cancer screening at various ages. This may include screening for: Colorectal cancer. Prostate cancer. Skin cancer. Lung  cancer. What should I know about heart disease, diabetes, and high blood pressure? Blood pressure and heart disease High blood pressure causes heart disease and increases the risk of stroke. This is more likely to develop in people who have high blood pressure readings or are overweight. Talk with your health care provider about your target blood pressure readings. Have your blood pressure checked: Every 3-5 years if you are 18-39 years of age. Every year if you are 40 years old or older. If you are between the ages of 65 and 75 and are a current or former smoker, ask your health care provider if you should have a one-time screening for abdominal aortic aneurysm (AAA). Diabetes Have regular diabetes screenings. This checks your fasting blood sugar level. Have the screening done: Once every three years after age 45 if you are at a normal weight and have a low risk for diabetes. More often and at a younger age if you are overweight or have a high risk for diabetes. What should I know about preventing infection? Hepatitis B If you have a higher risk for hepatitis B, you should be screened for this virus. Talk with your health care provider to find out if you are at risk for hepatitis B infection. Hepatitis C Blood testing is recommended for: Everyone born from 1945 through 1965. Anyone with known risk factors for hepatitis C. Sexually transmitted infections (STIs) You should be screened each year for STIs, including gonorrhea and chlamydia, if: You are sexually active and are younger than 46 years of age. You are older than 46 years of age and your   health care provider tells you that you are at risk for this type of infection. Your sexual activity has changed since you were last screened, and you are at increased risk for chlamydia or gonorrhea. Ask your health care provider if you are at risk. Ask your health care provider about whether you are at high risk for HIV. Your health care provider  may recommend a prescription medicine to help prevent HIV infection. If you choose to take medicine to prevent HIV, you should first get tested for HIV. You should then be tested every 3 months for as long as you are taking the medicine. Follow these instructions at home: Alcohol use Do not drink alcohol if your health care provider tells you not to drink. If you drink alcohol: Limit how much you have to 0-2 drinks a day. Know how much alcohol is in your drink. In the [place] one drink equals one 12 oz bottle of beer (355 mL), one 5 oz glass of wine (148 mL), or one 1 oz glass of hard liquor (44 mL). Lifestyle Do not use any products that contain nicotine or tobacco. These products include cigarettes, chewing tobacco, and vaping devices, such as e-cigarettes. If you need help quitting, ask your health care provider. Do not use street drugs. Do not share needles. Ask your health care provider for help if you need support or information about quitting drugs. General instructions Schedule regular health, dental, and eye exams. Stay current with your vaccines. Tell your health care provider if: You often feel depressed. You have ever been abused or do not feel safe at home. Summary Adopting a healthy lifestyle and getting preventive care are important in promoting health and wellness. Follow your health care provider's instructions about healthy diet, exercising, and getting tested or screened for diseases. Follow your health care provider's instructions on monitoring your cholesterol and blood pressure. This information is not intended to replace advice given to you by your health care provider. Make sure you discuss any questions you have with your health care provider. Document Revised: 01/05/2021 Document Reviewed: 01/05/2021 Elsevier Patient Education  2023 Elsevier Inc.  

## 2022-11-10 NOTE — Progress Notes (Unsigned)
Subjective:  Patient ID: Leon Carroll, male    DOB: 20-Jun-1976  Age: 47 y.o. MRN: [id number]  CC: Annual Exam and Hypertension   HPI Leon Carroll presents for a CPX and f/up -  He plays tennis  Outpatient Medications Prior to Visit  Medication Sig Dispense Refill   Eszopiclone 3 MG TABS Take 1 tablet (3 mg total) by mouth at bedtime. Take immediately before bedtime 90 tablet 3   irbesartan (AVAPRO) 150 MG tablet Take 1 tablet (150 mg total) by mouth daily. 90 tablet 3   tadalafil (CIALIS) 20 MG tablet Take 1 tablet (20 mg total) by mouth daily as needed for erectile dysfunction. 10 tablet 11   valACYclovir (VALTREX) 1000 MG tablet Take 1 tablet (1,000 mg total) by mouth 2 (two) times daily. 90 tablet 0   No facility-administered medications prior to visit.    ROS Review of Systems  Objective:  BP 132/78 (BP Location: Right Arm, Patient Position: Sitting, Cuff Size: Large)   Pulse 63   Temp 98.3 F (36.8 C) (Oral)   Resp 16   Ht '6\' 1"'$  (1.854 m)   Wt 218 lb (98.9 kg)   SpO2 96%   BMI 28.76 kg/m   BP Readings from Last 3 Encounters:  11/10/22 132/78  08/02/22 120/74  01/27/22 110/60    Wt Readings from Last 3 Encounters:  11/10/22 218 lb (98.9 kg)  08/02/22 214 lb (97.1 kg)  01/27/22 218 lb (98.9 kg)    Physical Exam  Lab Results  Component Value Date   WBC 5.0 01/27/2022   HGB 14.7 01/27/2022   HCT 43.1 01/27/2022   PLT 211.0 01/27/2022   GLUCOSE 97 01/27/2022   CHOL 184 01/27/2022   TRIG 120.0 01/27/2022   HDL 42.50 01/27/2022   LDLDIRECT 173.0 08/02/2013   LDLCALC 118 (H) 01/27/2022   ALT 12 01/27/2022   AST 14 01/27/2022   NA 137 01/27/2022   K 4.3 01/27/2022   CL 101 01/27/2022   CREATININE 1.10 01/27/2022   BUN 12 01/27/2022   CO2 28 01/27/2022   TSH 3.12 01/27/2022   PSA 0.41 01/27/2022    No results found.  Assessment & Plan:  Herpes simplex labialis -     valACYclovir HCl; Take 1 tablet (1,000 mg total) by mouth 2  (two) times daily.  Dispense: 90 tablet; Refill: 0  Insomnia due to psychological stress -     Eszopiclone; Take 1 tablet (3 mg total) by mouth at bedtime. Take immediately before bedtime  Dispense: 90 tablet; Refill: 1  Essential hypertension, benign -     Irbesartan; Take 1 tablet (150 mg total) by mouth daily.  Dispense: 90 tablet; Refill: 1 -     Basic metabolic panel; Future -     CBC with Differential/Platelet; Future  Drug-induced erectile dysfunction -     Tadalafil; Take 1 tablet (20 mg total) by mouth daily as needed for erectile dysfunction.  Dispense: 10 tablet; Refill: 5  Premature ejaculation, acquired, generalized, moderate -     PARoxetine HCl ER; Take 1 tablet (25 mg total) by mouth daily.  Dispense: 90 tablet; Refill: 0  GAD (generalized anxiety disorder) -     PARoxetine HCl ER; Take 1 tablet (25 mg total) by mouth daily.  Dispense: 90 tablet; Refill: 0  Screen for colon cancer -     Cologuard  B12 deficiency -     CBC with Differential/Platelet; Future -     Vitamin B12;  Future -     Folate; Future     Follow-up: Return in about 6 months (around 05/13/2023).  Scarlette Calico, MD

## 2022-11-11 DIAGNOSIS — Z0001 Encounter for general adult medical examination with abnormal findings: Secondary | ICD-10-CM | POA: Insufficient documentation

## 2022-11-12 ENCOUNTER — Ambulatory Visit (INDEPENDENT_AMBULATORY_CARE_PROVIDER_SITE_OTHER): Payer: 59

## 2022-11-12 DIAGNOSIS — E538 Deficiency of other specified B group vitamins: Secondary | ICD-10-CM | POA: Diagnosis not present

## 2022-11-12 MED ORDER — CYANOCOBALAMIN 1000 MCG/ML IJ SOLN
1000.0000 ug | Freq: Once | INTRAMUSCULAR | Status: AC
  Administered 2022-11-12: 1000 ug via INTRAMUSCULAR

## 2022-11-12 NOTE — Progress Notes (Signed)
Pt was given B12 w/o any complications. 

## 2022-12-17 ENCOUNTER — Ambulatory Visit (INDEPENDENT_AMBULATORY_CARE_PROVIDER_SITE_OTHER): Payer: 59

## 2022-12-17 DIAGNOSIS — E538 Deficiency of other specified B group vitamins: Secondary | ICD-10-CM | POA: Diagnosis not present

## 2022-12-17 MED ORDER — CYANOCOBALAMIN 1000 MCG/ML IJ SOLN
1000.0000 ug | Freq: Once | INTRAMUSCULAR | Status: AC
  Administered 2022-12-17: 1000 ug via INTRAMUSCULAR

## 2022-12-17 NOTE — Progress Notes (Signed)
B12 given.  Pt tolerated well. Pt is aware to give the office a call for an side effects or reactions. Please co-sign.   

## 2023-02-07 ENCOUNTER — Other Ambulatory Visit: Payer: Self-pay | Admitting: Internal Medicine

## 2023-02-07 DIAGNOSIS — F411 Generalized anxiety disorder: Secondary | ICD-10-CM

## 2023-02-07 DIAGNOSIS — F524 Premature ejaculation: Secondary | ICD-10-CM

## 2023-05-09 ENCOUNTER — Other Ambulatory Visit: Payer: Self-pay | Admitting: Internal Medicine

## 2023-05-09 DIAGNOSIS — F411 Generalized anxiety disorder: Secondary | ICD-10-CM

## 2023-05-09 DIAGNOSIS — F524 Premature ejaculation: Secondary | ICD-10-CM

## 2023-07-01 ENCOUNTER — Encounter: Payer: Self-pay | Admitting: Internal Medicine

## 2023-07-01 ENCOUNTER — Ambulatory Visit: Payer: 59 | Admitting: Internal Medicine

## 2023-07-01 VITALS — BP 120/86 | HR 78 | Temp 98.4°F | Ht 73.0 in | Wt 239.0 lb

## 2023-07-01 DIAGNOSIS — I10 Essential (primary) hypertension: Secondary | ICD-10-CM | POA: Diagnosis not present

## 2023-07-01 DIAGNOSIS — E538 Deficiency of other specified B group vitamins: Secondary | ICD-10-CM

## 2023-07-01 DIAGNOSIS — M545 Low back pain, unspecified: Secondary | ICD-10-CM | POA: Diagnosis not present

## 2023-07-01 MED ORDER — PREDNISONE 20 MG PO TABS: 40.0000 mg | ORAL_TABLET | Freq: Every day | ORAL | 0 refills | Status: AC

## 2023-07-01 MED ORDER — CYANOCOBALAMIN 1000 MCG/ML IJ SOLN
1000.0000 ug | Freq: Once | INTRAMUSCULAR | Status: AC
  Administered 2023-07-01: 1000 ug via INTRAMUSCULAR

## 2023-07-01 MED ORDER — IRBESARTAN 150 MG PO TABS: 150.0000 mg | ORAL_TABLET | Freq: Every day | ORAL | 1 refills | Status: DC

## 2023-07-01 MED ORDER — METHOCARBAMOL 500 MG PO TABS: 500.0000 mg | ORAL_TABLET | Freq: Three times a day (TID) | ORAL | 0 refills | Status: DC | PRN

## 2023-07-01 NOTE — Progress Notes (Signed)
Subjective:    Patient ID: Leon Carroll, male    DOB: 1976/08/05, 47 y.o.   MRN: [id number]      HPI Leon Carroll is here for  Chief Complaint  Patient presents with   Back Pain    Right sided back pain   He has pain located in the right lower back without radiation.  The pain started several weeks ago without any obvious injury or cause.  He has not had insurance for a while so he was not able to come in sooner.  The pain has progressively gotten worse since it started.  The pain can be pretty severe at times.  In the morning when he is getting dressed and putting on his socks and shoes he starts to sweat because the pain hurts so much.  There is no radiation of the pain into the buttock region or leg.  At times the pain radiates across the lower back.  He states it almost feels like a muscle cramp that just will not let up.  He denies having any pain similar to this in the past.  Every position seems to aggravate it-sitting, sleeping, moving around.  He has tried several things including Advil, Aleve, Voltaren gel, ice, heat and the ice may have helped slightly because it numbed it, but nothing really helped.   He has not been able to get his monthly B12 injection and was wondering if he could get that or if he should continue with that.  Needs a refill of his blood pressure medication.  Medications and allergies reviewed with patient and updated if appropriate.  Current Outpatient Medications on File Prior to Visit  Medication Sig Dispense Refill   Eszopiclone 3 MG TABS Take 1 tablet (3 mg total) by mouth at bedtime. Take immediately before bedtime 90 tablet 1   irbesartan (AVAPRO) 150 MG tablet Take 1 tablet (150 mg total) by mouth daily. 90 tablet 1   PARoxetine (PAXIL-CR) 25 MG 24 hr tablet TAKE 1 TABLET BY MOUTH DAILY 90 tablet 0   tadalafil (CIALIS) 20 MG tablet Take 1 tablet (20 mg total) by mouth daily as needed for erectile dysfunction. 10 tablet 5   valACYclovir  (VALTREX) 1000 MG tablet Take 1 tablet (1,000 mg total) by mouth 2 (two) times daily. 90 tablet 0   No current facility-administered medications on file prior to visit.    Review of Systems     Objective:   Vitals:   07/01/23 1550  BP: 120/86  Pulse: 78  Temp: 98.4 F (36.9 C)  SpO2: 98%   BP Readings from Last 3 Encounters:  07/01/23 120/86  11/10/22 132/78  08/02/22 120/74   Wt Readings from Last 3 Encounters:  07/01/23 239 lb (108.4 kg)  11/10/22 218 lb (98.9 kg)  08/02/22 214 lb (97.1 kg)   Body mass index is 31.53 kg/m.    Physical Exam Constitutional:      General: He is not in acute distress.    Appearance: Normal appearance. He is not ill-appearing.  HENT:     Head: Normocephalic and atraumatic.  Abdominal:     General: There is no distension.     Palpations: Abdomen is soft.     Tenderness: There is no abdominal tenderness.  Musculoskeletal:        General: No swelling, tenderness (No tenderness along lumbar spine.  No tenderness with palpation in right lower back) or deformity. Normal range of motion.     Right lower  leg: No edema.     Left lower leg: No edema.  Skin:    General: Skin is warm and dry.     Findings: No rash.  Neurological:     Mental Status: He is alert.     Sensory: No sensory deficit.     Motor: No weakness.     Gait: Gait normal.            Assessment & Plan:    Right lower back pain: Acute Started several weeks ago and has been progressively getting worse Located in the right lower back, occasionally radiates across the lower back, but no pain into the legs, numbness or tingling or leg weakness ?  Muscular in nature No obvious cause Prednisone 40 mg daily x 5 days Methocarbamol 500 mg every 8 hours as needed Call if no improvement  B12 deficiency: Was getting monthly B12 injections and would like to restart that B12 injection today-advised to schedule monthly B12 injections when he  leaves  Hypertension: Chronic Blood pressure controlled-diastolic slightly elevated, but he is in pain Continue Avapro 150 mg daily-refilled

## 2023-07-01 NOTE — Patient Instructions (Addendum)
      B12 injection today     Medications changes include :  methocarbamol - muscle relaxer, prednisone x 5 days     Return if symptoms worsen or fail to improve.

## 2023-07-11 ENCOUNTER — Encounter: Payer: Self-pay | Admitting: Internal Medicine

## 2023-07-11 DIAGNOSIS — M545 Low back pain, unspecified: Secondary | ICD-10-CM

## 2023-07-12 MED ORDER — MELOXICAM 15 MG PO TABS: 15.0000 mg | ORAL_TABLET | Freq: Every day | ORAL | 0 refills | Status: DC

## 2023-07-13 NOTE — Progress Notes (Unsigned)
    Aleen Sells D.[facility] [address] [place] [address] Phone: [phone]   Assessment and Plan:     There are no diagnoses linked to this encounter.  ***   Pertinent previous records reviewed include ***    Follow Up: ***     Subjective:   I, Leon Carroll, am serving as a Neurosurgeon for Doctor Richardean Sale  Chief Complaint: low back pain   HPI:   07/14/2023 Patient is a 47 year old male with concerns of low back pain. Patient states  Relevant Historical Information: ***  Additional pertinent review of systems negative.   Current Outpatient Medications:    Eszopiclone 3 MG TABS, Take 1 tablet (3 mg total) by mouth at bedtime. Take immediately before bedtime, Disp: 90 tablet, Rfl: 1   irbesartan (AVAPRO) 150 MG tablet, Take 1 tablet (150 mg total) by mouth daily., Disp: 90 tablet, Rfl: 1   meloxicam (MOBIC) 15 MG tablet, Take 1 tablet (15 mg total) by mouth daily., Disp: 14 tablet, Rfl: 0   methocarbamol (ROBAXIN) 500 MG tablet, Take 1 tablet (500 mg total) by mouth every 8 (eight) hours as needed for muscle spasms., Disp: 30 tablet, Rfl: 0   PARoxetine (PAXIL-CR) 25 MG 24 hr tablet, TAKE 1 TABLET BY MOUTH DAILY, Disp: 90 tablet, Rfl: 0   tadalafil (CIALIS) 20 MG tablet, Take 1 tablet (20 mg total) by mouth daily as needed for erectile dysfunction., Disp: 10 tablet, Rfl: 5   valACYclovir (VALTREX) 1000 MG tablet, Take 1 tablet (1,000 mg total) by mouth 2 (two) times daily., Disp: 90 tablet, Rfl: 0   Objective:     There were no vitals filed for this visit.    There is no height or weight on file to calculate BMI.    Physical Exam:    ***   Electronically signed by:  Aleen Sells [facility] 4:30 PM 07/13/23

## 2023-07-14 ENCOUNTER — Ambulatory Visit: Payer: 59 | Admitting: Sports Medicine

## 2023-07-14 ENCOUNTER — Ambulatory Visit (INDEPENDENT_AMBULATORY_CARE_PROVIDER_SITE_OTHER): Payer: 59

## 2023-07-14 VITALS — BP 118/82 | HR 72 | Ht 73.0 in | Wt 242.0 lb

## 2023-07-14 DIAGNOSIS — G8929 Other chronic pain: Secondary | ICD-10-CM | POA: Diagnosis not present

## 2023-07-14 DIAGNOSIS — M545 Low back pain, unspecified: Secondary | ICD-10-CM

## 2023-07-14 MED ORDER — MELOXICAM 15 MG PO TABS: 15.0000 mg | ORAL_TABLET | Freq: Every day | ORAL | 0 refills | Status: DC

## 2023-07-14 NOTE — Patient Instructions (Addendum)
Low back HEP - Start meloxicam 15 mg daily x2 weeks.  If still having pain after 2 weeks, complete 3rd-week of meloxicam. May use remaining meloxicam as needed once daily for pain control.  Do not to use additional NSAIDs while taking meloxicam.  May use Tylenol [phone] mg 2 to 3 times a day for breakthrough pain. Xrays on the way out  4 week follow up

## 2023-07-26 ENCOUNTER — Ambulatory Visit: Payer: 59 | Admitting: Internal Medicine

## 2023-07-26 ENCOUNTER — Encounter: Payer: Self-pay | Admitting: Internal Medicine

## 2023-07-26 ENCOUNTER — Other Ambulatory Visit (INDEPENDENT_AMBULATORY_CARE_PROVIDER_SITE_OTHER): Payer: 59

## 2023-07-26 VITALS — BP 130/86 | HR 66 | Temp 98.2°F | Ht 73.0 in | Wt 249.2 lb

## 2023-07-26 DIAGNOSIS — E785 Hyperlipidemia, unspecified: Secondary | ICD-10-CM

## 2023-07-26 DIAGNOSIS — Z1211 Encounter for screening for malignant neoplasm of colon: Secondary | ICD-10-CM

## 2023-07-26 DIAGNOSIS — E538 Deficiency of other specified B group vitamins: Secondary | ICD-10-CM | POA: Diagnosis not present

## 2023-07-26 DIAGNOSIS — K591 Functional diarrhea: Secondary | ICD-10-CM | POA: Insufficient documentation

## 2023-07-26 DIAGNOSIS — K8020 Calculus of gallbladder without cholecystitis without obstruction: Secondary | ICD-10-CM

## 2023-07-26 DIAGNOSIS — I10 Essential (primary) hypertension: Secondary | ICD-10-CM

## 2023-07-26 DIAGNOSIS — K582 Mixed irritable bowel syndrome: Secondary | ICD-10-CM

## 2023-07-26 DIAGNOSIS — R21 Rash and other nonspecific skin eruption: Secondary | ICD-10-CM | POA: Insufficient documentation

## 2023-07-26 DIAGNOSIS — L989 Disorder of the skin and subcutaneous tissue, unspecified: Secondary | ICD-10-CM | POA: Insufficient documentation

## 2023-07-26 LAB — CBC WITH DIFFERENTIAL/PLATELET
Basophils Absolute: 0 10*3/uL (ref 0.0–0.1)
Basophils Relative: 0.6 % (ref 0.0–3.0)
Eosinophils Absolute: 0.3 10*3/uL (ref 0.0–0.7)
Eosinophils Relative: 6.2 % — ABNORMAL HIGH (ref 0.0–5.0)
HCT: 43 % (ref 39.0–52.0)
Hemoglobin: 14.6 g/dL (ref 13.0–17.0)
Lymphocytes Relative: 45.1 % (ref 12.0–46.0)
Lymphs Abs: 2.3 10*3/uL (ref 0.7–4.0)
MCHC: 34 g/dL (ref 30.0–36.0)
MCV: 92 fL (ref 78.0–100.0)
Monocytes Absolute: 0.3 10*3/uL (ref 0.1–1.0)
Monocytes Relative: 6.5 % (ref 3.0–12.0)
Neutro Abs: 2.2 10*3/uL (ref 1.4–7.7)
Neutrophils Relative %: 41.6 % — ABNORMAL LOW (ref 43.0–77.0)
Platelets: 220 10*3/uL (ref 150.0–400.0)
RBC: 4.68 Mil/uL (ref 4.22–5.81)
RDW: 12.8 % (ref 11.5–15.5)
WBC: 5.2 10*3/uL (ref 4.0–10.5)

## 2023-07-26 LAB — LIPID PANEL
Cholesterol: 252 mg/dL — ABNORMAL HIGH (ref 0–200)
HDL: 37.4 mg/dL — ABNORMAL LOW (ref >39.00)
LDL Cholesterol: 175 mg/dL — ABNORMAL HIGH (ref 0–99)
NonHDL: 215.03
Total CHOL/HDL Ratio: 7
Triglycerides: 201 mg/dL — ABNORMAL HIGH (ref 0.0–149.0)
VLDL: 40.2 mg/dL — ABNORMAL HIGH (ref 0.0–40.0)

## 2023-07-26 LAB — BASIC METABOLIC PANEL
BUN: 15 mg/dL (ref 6–23)
CO2: 29 meq/L (ref 19–32)
Calcium: 9.2 mg/dL (ref 8.4–10.5)
Chloride: 102 meq/L (ref 96–112)
Creatinine, Ser: 1.27 mg/dL (ref 0.40–1.50)
GFR: 67.59 mL/min (ref >60.00)
Glucose, Bld: 98 mg/dL (ref 70–99)
Potassium: 4 meq/L (ref 3.5–5.1)
Sodium: 137 meq/L (ref 135–145)

## 2023-07-26 LAB — HEPATIC FUNCTION PANEL
ALT: 29 U/L (ref 0–53)
AST: 20 U/L (ref 0–37)
Albumin: 4.5 g/dL (ref 3.5–5.2)
Alkaline Phosphatase: 45 U/L (ref 39–117)
Bilirubin, Direct: 0.2 mg/dL (ref 0.0–0.3)
Total Bilirubin: 1.1 mg/dL (ref 0.2–1.2)
Total Protein: 7 g/dL (ref 6.0–8.3)

## 2023-07-26 MED ORDER — HYOSCYAMINE SULFATE ER 0.375 MG PO TB12: 0.3750 mg | ORAL_TABLET | Freq: Two times a day (BID) | ORAL | 0 refills | Status: DC

## 2023-07-26 NOTE — Progress Notes (Signed)
Subjective:  Patient ID: Leon Carroll, male    DOB: 29-Oct-1975  Age: 47 y.o. MRN: [id number]  CC: Hyperlipidemia, Hypertension, and Rash   HPI [name] presents for f/up ---   Discussed the use of AI scribe software for clinical note transcription with the patient, who gave verbal consent to proceed.  History of Present Illness   The patient, with a history of hypertension, presented for a medication refill after a lapse in health insurance coverage. He reported persistent lower back pain, initially thought to be due to a tennis injury. The pain improved with a short course of prednisone and meloxicam but returned after the prednisone was discontinued. Despite further management with meloxicam, the pain has not completely resolved. An X-ray was performed, which reportedly showed no significant findings.  The patient also reported changes in bowel habits, characterized by periods of constipation followed by episodes of diarrhea. He described a pattern of not having bowel movements for several days, followed by multiple bowel movements in a single day, transitioning from solid to loose stools. He denied any blood in the stool. He also reported occasional heartburn, particularly after consuming certain foods.  In addition to these symptoms, the patient expressed concern about recent weight gain and persistent bloating. He reported a weight increase from around 216 pounds to almost 250 pounds over a few months. He wondered if this could be related to his Paxil medication. He also reported feeling "blubbery and bloated" most of the time.  Lastly, the patient mentioned a previous diagnosis of gallstones and elevated bilirubin levels. He was advised to have his gallbladder removed but was unable to schedule the surgery due to insurance issues. He did not report any current symptoms related to this condition.       Outpatient Medications Prior to Visit  Medication Sig Dispense  Refill   Eszopiclone 3 MG TABS Take 1 tablet (3 mg total) by mouth at bedtime. Take immediately before bedtime 90 tablet 1   irbesartan (AVAPRO) 150 MG tablet Take 1 tablet (150 mg total) by mouth daily. 90 tablet 1   meloxicam (MOBIC) 15 MG tablet Take 1 tablet (15 mg total) by mouth daily. 14 tablet 0   methocarbamol (ROBAXIN) 500 MG tablet Take 1 tablet (500 mg total) by mouth every 8 (eight) hours as needed for muscle spasms. 30 tablet 0   PARoxetine (PAXIL-CR) 25 MG 24 hr tablet TAKE 1 TABLET BY MOUTH DAILY 90 tablet 0   tadalafil (CIALIS) 20 MG tablet Take 1 tablet (20 mg total) by mouth daily as needed for erectile dysfunction. 10 tablet 5   valACYclovir (VALTREX) 1000 MG tablet Take 1 tablet (1,000 mg total) by mouth 2 (two) times daily. 90 tablet 0   No facility-administered medications prior to visit.    ROS Review of Systems  Constitutional:  Positive for unexpected weight change (wt gain). Negative for appetite change, diaphoresis, fatigue and fever.  HENT: Negative.    Eyes:  Negative for visual disturbance.  Respiratory:  Positive for apnea. Negative for cough, shortness of breath and wheezing.   Cardiovascular:  Negative for chest pain, palpitations and leg swelling.  Gastrointestinal:  Positive for constipation and diarrhea. Negative for abdominal pain, blood in stool, nausea and vomiting.  Genitourinary: Negative.  Negative for difficulty urinating, penile swelling, scrotal swelling and testicular pain.  Musculoskeletal: Negative.   Skin:  Positive for rash.  Neurological: Negative.  Negative for weakness.  Hematological:  Negative for adenopathy. Does not bruise/bleed  easily.  Psychiatric/Behavioral:  Positive for dysphoric mood. Negative for behavioral problems, confusion, decreased concentration, hallucinations, self-injury, sleep disturbance and suicidal ideas. The patient is nervous/anxious. The patient is not hyperactive.     Objective:  BP 130/86 (BP Location:  Left Arm, Patient Position: Sitting, Cuff Size: Large)   Pulse 66   Temp 98.2 F (36.8 C) (Oral)   Ht 6\' 1"  (1.854 m)   Wt 249 lb 3.2 oz (113 kg)   SpO2 97%   BMI 32.88 kg/m   BP Readings from Last 3 Encounters:  07/26/23 130/86  07/14/23 118/82  07/01/23 120/86    Wt Readings from Last 3 Encounters:  07/26/23 249 lb 3.2 oz (113 kg)  07/14/23 242 lb (109.8 kg)  07/01/23 239 lb (108.4 kg)       Physical Exam Vitals reviewed.  Constitutional:      Appearance: Normal appearance.  HENT:     Nose: Nose normal.     Mouth/Throat:     Mouth: Mucous membranes are moist.  Eyes:     General: No scleral icterus.    Conjunctiva/sclera: Conjunctivae normal.  Cardiovascular:     Rate and Rhythm: Normal rate and regular rhythm.     Heart sounds: No murmur heard.    No friction rub. No gallop.  Pulmonary:     Effort: Pulmonary effort is normal.     Breath sounds: No stridor. No wheezing, rhonchi or rales.  Abdominal:     General: Abdomen is flat.     Palpations: There is no mass.     Tenderness: There is no abdominal tenderness. There is no guarding.     Hernia: No hernia is present.  Musculoskeletal:     Cervical back: Neck supple.  Lymphadenopathy:     Cervical: No cervical adenopathy.  Skin:    General: Skin is warm.     Findings: Erythema, lesion and rash present.  Neurological:     General: No focal deficit present.     Mental Status: He is alert. Mental status is at baseline.  Psychiatric:        Mood and Affect: Mood normal.        Behavior: Behavior normal.        Thought Content: Thought content normal.        Judgment: Judgment normal.     Lab Results  Component Value Date   WBC 5.2 07/26/2023   HGB 14.6 07/26/2023   HCT 43.0 07/26/2023   PLT 220.0 07/26/2023   GLUCOSE 98 07/26/2023   CHOL 252 (H) 07/26/2023   TRIG 201.0 (H) 07/26/2023   HDL 37.40 (L) 07/26/2023   LDLDIRECT 173.0 08/02/2013   LDLCALC 175 (H) 07/26/2023   ALT 29 07/26/2023   AST  20 07/26/2023   NA 137 07/26/2023   K 4.0 07/26/2023   CL 102 07/26/2023   CREATININE 1.27 07/26/2023   BUN 15 07/26/2023   CO2 29 07/26/2023   TSH 1.70 07/26/2023   PSA 0.41 01/27/2022    No results found.  Assessment & Plan:  Screen for colon cancer -     Ambulatory referral to Gastroenterology  Essential hypertension, benign- His BP is well controlled. -     Basic metabolic panel; Future -     TSH; Future -     Hepatic function panel; Future -     CBC with Differential/Platelet; Future  Hyperlipidemia with target LDL less than 130- Statin is not indicated. -     Lipid panel;  Future -     TSH; Future -     Hepatic function panel; Future  B12 deficiency- I have asked him to restart parenteral B12 replacement therapy. -     Vitamin B12; Future -     Gliadin antibodies, serum -     Tissue transglutaminase, IgA -     Reticulin Antibody, IgA w titer -     Folate; Future -     CBC with Differential/Platelet; Future  Gallstones -     Ambulatory referral to General Surgery  Functional diarrhea -     Gliadin antibodies, serum -     Tissue transglutaminase, IgA -     Reticulin Antibody, IgA w titer  Rash -     Gliadin antibodies, serum -     Tissue transglutaminase, IgA -     Reticulin Antibody, IgA w titer -     Ambulatory referral to Dermatology  Skin lesion of back -     Ambulatory referral to Dermatology  Irritable bowel syndrome with both constipation and diarrhea -     Hyoscyamine Sulfate ER; Take 1 tablet (0.375 mg total) by mouth 2 (two) times daily.  Dispense: 180 tablet; Refill: 0     Follow-up: Return in about 6 months (around 01/23/2024).  Sanda Linger, MD

## 2023-07-26 NOTE — Patient Instructions (Signed)
Hypertension, Adult High blood pressure (hypertension) is when the force of blood pumping through the arteries is too strong. The arteries are the blood vessels that carry blood from the heart throughout the body. Hypertension forces the heart to work harder to pump blood and may cause arteries to become narrow or stiff. Untreated or uncontrolled hypertension can lead to a heart attack, heart failure, a stroke, kidney disease, and other problems. A blood pressure reading consists of a higher number over a lower number. Ideally, your blood pressure should be below 120/80. The first ("top") number is called the systolic pressure. It is a measure of the pressure in your arteries as your heart beats. The second ("bottom") number is called the diastolic pressure. It is a measure of the pressure in your arteries as the heart relaxes. What are the causes? The exact cause of this condition is not known. There are some conditions that result in high blood pressure. What increases the risk? Certain factors may make you more likely to develop high blood pressure. Some of these risk factors are under your control, including: Smoking. Not getting enough exercise or physical activity. Being overweight. Having too much fat, sugar, calories, or salt (sodium) in your diet. Drinking too much alcohol. Other risk factors include: Having a personal history of heart disease, diabetes, high cholesterol, or kidney disease. Stress. Having a family history of high blood pressure and high cholesterol. Having obstructive sleep apnea. Age. The risk increases with age. What are the signs or symptoms? High blood pressure may not cause symptoms. Very high blood pressure (hypertensive crisis) may cause: Headache. Fast or irregular heartbeats (palpitations). Shortness of breath. Nosebleed. Nausea and vomiting. Vision changes. Severe chest pain, dizziness, and seizures. How is this diagnosed? This condition is diagnosed by  measuring your blood pressure while you are seated, with your arm resting on a flat surface, your legs uncrossed, and your feet flat on the floor. The cuff of the blood pressure monitor will be placed directly against the skin of your upper arm at the level of your heart. Blood pressure should be measured at least twice using the same arm. Certain conditions can cause a difference in blood pressure between your right and left arms. If you have a high blood pressure reading during one visit or you have normal blood pressure with other risk factors, you may be asked to: Return on a different day to have your blood pressure checked again. Monitor your blood pressure at home for 1 week or longer. If you are diagnosed with hypertension, you may have other blood or imaging tests to help your health care provider understand your overall risk for other conditions. How is this treated? This condition is treated by making healthy lifestyle changes, such as eating healthy foods, exercising more, and reducing your alcohol intake. You may be referred for counseling on a healthy diet and physical activity. Your health care provider may prescribe medicine if lifestyle changes are not enough to get your blood pressure under control and if: Your systolic blood pressure is above 130. Your diastolic blood pressure is above 80. Your personal target blood pressure may vary depending on your medical conditions, your age, and other factors. Follow these instructions at home: Eating and drinking  Eat a diet that is high in fiber and potassium, and low in sodium, added sugar, and fat. An example of this eating plan is called the DASH diet. DASH stands for Dietary Approaches to Stop Hypertension. To eat this way: Eat   plenty of fresh fruits and vegetables. Try to fill one half of your plate at each meal with fruits and vegetables. Eat whole grains, such as whole-wheat pasta, brown rice, or whole-grain bread. Fill about one  fourth of your plate with whole grains. Eat or drink low-fat dairy products, such as skim milk or low-fat yogurt. Avoid fatty cuts of meat, processed or cured meats, and poultry with skin. Fill about one fourth of your plate with lean proteins, such as fish, chicken without skin, beans, eggs, or tofu. Avoid pre-made and processed foods. These tend to be higher in sodium, added sugar, and fat. Reduce your daily sodium intake. Many people with hypertension should eat less than 1,500 mg of sodium a day. Do not drink alcohol if: Your health care provider tells you not to drink. You are pregnant, may be pregnant, or are planning to become pregnant. If you drink alcohol: Limit how much you have to: 0-1 drink a day for women. 0-2 drinks a day for men. Know how much alcohol is in your drink. In the [place] one drink equals one 12 oz bottle of beer (355 mL), one 5 oz glass of wine (148 mL), or one 1 oz glass of hard liquor (44 mL). Lifestyle  Work with your health care provider to maintain a healthy body weight or to lose weight. Ask what an ideal weight is for you. Get at least 30 minutes of exercise that causes your heart to beat faster (aerobic exercise) most days of the week. Activities may include walking, swimming, or biking. Include exercise to strengthen your muscles (resistance exercise), such as Pilates or lifting weights, as part of your weekly exercise routine. Try to do these types of exercises for 30 minutes at least 3 days a week. Do not use any products that contain nicotine or tobacco. These products include cigarettes, chewing tobacco, and vaping devices, such as e-cigarettes. If you need help quitting, ask your health care provider. Monitor your blood pressure at home as told by your health care provider. Keep all follow-up visits. This is important. Medicines Take over-the-counter and prescription medicines only as told by your health care provider. Follow directions carefully. Blood  pressure medicines must be taken as prescribed. Do not skip doses of blood pressure medicine. Doing this puts you at risk for problems and can make the medicine less effective. Ask your health care provider about side effects or reactions to medicines that you should watch for. Contact a health care provider if you: Think you are having a reaction to a medicine you are taking. Have headaches that keep coming back (recurring). Feel dizzy. Have swelling in your ankles. Have trouble with your vision. Get help right away if you: Develop a severe headache or confusion. Have unusual weakness or numbness. Feel faint. Have severe pain in your chest or abdomen. Vomit repeatedly. Have trouble breathing. These symptoms may be an emergency. Get help right away. Call 911. Do not wait to see if the symptoms will go away. Do not drive yourself to the hospital. Summary Hypertension is when the force of blood pumping through your arteries is too strong. If this condition is not controlled, it may put you at risk for serious complications. Your personal target blood pressure may vary depending on your medical conditions, your age, and other factors. For most people, a normal blood pressure is less than 120/80. Hypertension is treated with lifestyle changes, medicines, or a combination of both. Lifestyle changes include losing weight, eating a healthy,   low-sodium diet, exercising more, and limiting alcohol. This information is not intended to replace advice given to you by your health care provider. Make sure you discuss any questions you have with your health care provider. Document Revised: 06/23/2021 Document Reviewed: 06/23/2021 Elsevier Patient Education  2024 [employer] Inc.  

## 2023-07-27 LAB — VITAMIN B12: Vitamin B-12: 184 pg/mL — ABNORMAL LOW (ref 211–911)

## 2023-07-27 LAB — FOLATE: Folate: 10.4 ng/mL (ref >5.9)

## 2023-07-27 LAB — TSH: TSH: 1.7 u[IU]/mL (ref 0.35–5.50)

## 2023-08-01 ENCOUNTER — Encounter: Payer: Self-pay | Admitting: Nurse Practitioner

## 2023-08-03 ENCOUNTER — Encounter: Payer: Self-pay | Admitting: Internal Medicine

## 2023-08-04 ENCOUNTER — Other Ambulatory Visit: Payer: Self-pay | Admitting: Internal Medicine

## 2023-08-04 DIAGNOSIS — F5102 Adjustment insomnia: Secondary | ICD-10-CM

## 2023-08-04 DIAGNOSIS — F411 Generalized anxiety disorder: Secondary | ICD-10-CM

## 2023-08-04 MED ORDER — ESZOPICLONE 3 MG PO TABS: 3.0000 mg | ORAL_TABLET | Freq: Every day | ORAL | 0 refills | Status: DC

## 2023-08-04 MED ORDER — PAROXETINE HCL ER 12.5 MG PO TB24: 12.5000 mg | ORAL_TABLET | Freq: Every day | ORAL | 0 refills | Status: DC

## 2023-08-10 NOTE — Progress Notes (Unsigned)
    Aleen Sells D.[facility] [address] [place] [address] Phone: [phone]   Assessment and Plan:     There are no diagnoses linked to this encounter.  ***   Pertinent previous records reviewed include ***    Follow Up: ***     Subjective:   I, Leon Carroll, am serving as a Neurosurgeon for Doctor Richardean Sale   Chief Complaint: low back pain    HPI:    07/14/2023 Patient is a 47 year old male with concerns of low back pain. Patient states  was seen by burns 07/01/2023 He has pain located in the right lower back without radiation.  The pain started several weeks ago without any obvious injury or cause.  The pain has progressively gotten worse since it started. The pain can be pretty severe at times.  In the morning when he is getting dressed and putting on his socks and shoes he starts to sweat because the pain hurts so much. Pain is across the low back .  No numbness or tingling.   He states it almost feels like a muscle cramp that just will not let up.  He denies having any pain similar to this in the past.  Every position seems to aggravate it-sitting, sleeping, moving around. He has tried several things including Advil, Aleve, Voltaren gel, ice, heat and the ice may have helped slightly because it numbed it, but nothing really helped. He has been using prednisone, robaxin , and meloxicam. The pain is still there  08/11/2023 Patient states   Relevant Historical Information: Hypertension, Additional pertinent review of systems negative.   Current Outpatient Medications:    Eszopiclone 3 MG TABS, Take 1 tablet (3 mg total) by mouth at bedtime. Take immediately before bedtime, Disp: 90 tablet, Rfl: 0   hyoscyamine (LEVBID) 0.375 MG 12 hr tablet, Take 1 tablet (0.375 mg total) by mouth 2 (two) times daily., Disp: 180 tablet, Rfl: 0   irbesartan (AVAPRO) 150 MG tablet, Take 1 tablet (150 mg total) by mouth daily., Disp: 90 tablet,  Rfl: 1   meloxicam (MOBIC) 15 MG tablet, Take 1 tablet (15 mg total) by mouth daily., Disp: 14 tablet, Rfl: 0   methocarbamol (ROBAXIN) 500 MG tablet, Take 1 tablet (500 mg total) by mouth every 8 (eight) hours as needed for muscle spasms., Disp: 30 tablet, Rfl: 0   PARoxetine (PAXIL CR) 12.5 MG 24 hr tablet, Take 1 tablet (12.5 mg total) by mouth daily., Disp: 90 tablet, Rfl: 0   tadalafil (CIALIS) 20 MG tablet, Take 1 tablet (20 mg total) by mouth daily as needed for erectile dysfunction., Disp: 10 tablet, Rfl: 5   valACYclovir (VALTREX) 1000 MG tablet, Take 1 tablet (1,000 mg total) by mouth 2 (two) times daily., Disp: 90 tablet, Rfl: 0   Objective:     There were no vitals filed for this visit.    There is no height or weight on file to calculate BMI.    Physical Exam:    ***   Electronically signed by:  Aleen Sells D.[facility] 8:04 AM 08/10/23

## 2023-08-11 ENCOUNTER — Ambulatory Visit: Payer: 59 | Admitting: Sports Medicine

## 2023-08-12 ENCOUNTER — Other Ambulatory Visit: Payer: Self-pay | Admitting: Internal Medicine

## 2023-08-12 DIAGNOSIS — F524 Premature ejaculation: Secondary | ICD-10-CM

## 2023-08-12 DIAGNOSIS — F411 Generalized anxiety disorder: Secondary | ICD-10-CM

## 2023-08-19 ENCOUNTER — Encounter: Payer: Self-pay | Admitting: Internal Medicine

## 2023-08-30 ENCOUNTER — Other Ambulatory Visit: Payer: Self-pay | Admitting: Internal Medicine

## 2023-08-30 DIAGNOSIS — F411 Generalized anxiety disorder: Secondary | ICD-10-CM

## 2023-08-30 MED ORDER — PAROXETINE HCL ER 25 MG PO TB24: 25.0000 mg | ORAL_TABLET | Freq: Every day | ORAL | 0 refills | Status: DC

## 2023-09-12 ENCOUNTER — Encounter: Payer: Self-pay | Admitting: Internal Medicine

## 2023-09-12 ENCOUNTER — Ambulatory Visit: Payer: 59 | Admitting: Internal Medicine

## 2023-09-12 VITALS — BP 130/86 | HR 78 | Temp 97.9°F | Resp 16 | Ht 73.0 in | Wt 253.2 lb

## 2023-09-12 DIAGNOSIS — E538 Deficiency of other specified B group vitamins: Secondary | ICD-10-CM

## 2023-09-12 DIAGNOSIS — F3341 Major depressive disorder, recurrent, in partial remission: Secondary | ICD-10-CM | POA: Diagnosis not present

## 2023-09-12 DIAGNOSIS — I10 Essential (primary) hypertension: Secondary | ICD-10-CM | POA: Diagnosis not present

## 2023-09-12 DIAGNOSIS — F5102 Adjustment insomnia: Secondary | ICD-10-CM

## 2023-09-12 MED ORDER — CYANOCOBALAMIN 1000 MCG/ML IJ SOLN
1000.0000 ug | Freq: Once | INTRAMUSCULAR | Status: AC
  Administered 2023-09-12: 1000 ug via INTRAMUSCULAR

## 2023-09-12 MED ORDER — VORTIOXETINE HBR 5 MG PO TABS
5.0000 mg | ORAL_TABLET | Freq: Every day | ORAL | 0 refills | Status: DC
Start: 2023-09-12 — End: 2023-10-09

## 2023-09-12 MED ORDER — PAROXETINE HCL ER 12.5 MG PO TB24: 12.5000 mg | ORAL_TABLET | Freq: Every day | ORAL | 0 refills | Status: DC

## 2023-09-12 NOTE — Patient Instructions (Signed)
 We will slowly taper the paxil  dose and increase the trentellix dose    Major Depressive Disorder, Adult Major depressive disorder (MDD) is a mental health condition. It may also be called clinical depression or unipolar depression. MDD causes symptoms of sadness, hopelessness, and loss of interest in things. These symptoms last most of the day, almost every day, for 2 weeks. MDD can also cause physical symptoms. It can interfere with relationships and activities, such as work, school, and activities that are usually pleasant. MDD may be mild, moderate, or severe. It may be single-episode MDD, which happens once, or recurrent MDD, which may occur many times. What are the causes? The exact cause of this condition is not known. What increases the risk? The following factors may make someone more likely to develop MDD: A family history of depression. Being male. Long-term (chronic) stress, physical illness, other mental health disorders, or substance misuse. Trauma, including: Family problems. Violence or abuse. Loss of a parent or close family member. Experiencing discrimination. What are the signs or symptoms? The main symptoms of MDD usually include: Constant depressed or irritable mood. A loss of interest in activities. Sleeping or eating too much or too little. Tiredness or low energy. Other symptoms include: Unexplained weight gain or weight loss. Being agitated, restless, or weak. Feeling hopeless, worthless, or guilty. Trouble thinking clearly or making decisions. Thoughts of suicide or harming others. Spending a lot of time alone. Not being able to complete daily tasks or work. Severe symptoms of this condition may include: Psychotic depression.This may include false beliefs or delusions. It may also include seeing, hearing, tasting, smelling, or feeling things that are not real (hallucinations). Chronic depression or persistent depressive disorder. This is low-level  depression that lasts for at least 2 years. Melancholic depression, or feeling extremely sad and hopeless. Catatonic depression, which includes trouble speaking and trouble moving. Seasonal depression, which is caused by changes in the seasons. How is this diagnosed? This condition may be diagnosed based on: Your symptoms. Your medical and mental health history. A physical exam. Blood tests to rule out other conditions. MDD is confirmed if you have either a depressed mood or loss of interest and at least four other MDD symptoms, most of the day, nearly every day, in a 2-week period. How is this treated? This condition is usually treated by mental health professionals, such as psychologists, psychiatrists, and clinical social workers. You may need more than one type of treatment. Treatment may include: Psychotherapy, also called talk therapy or counseling. Types of psychotherapy include: Cognitive behavioral therapy (CBT). This teaches you to recognize unhealthy feelings, thoughts, and behaviors, and replace them with positive thoughts and actions. Interpersonal therapy (IPT). This helps you to improve the way you communicate with others or relate to them. Family therapy. This treatment includes members of your family. Medicines to treat anxiety and depression. These medicines help to balance the brain chemicals that affect your emotions. Lifestyle changes. You may be asked to: Limit alcohol use and avoid drug use. Get regular exercise. Get plenty of sleep. Make healthy eating choices. Spend more time outdoors. Brain stimulation. This may be done if symptoms are very severe and other treatments have not worked. Examples of this treatment are electroconvulsive therapy and transcranial magnetic stimulation. Follow these instructions at home: Alcohol use Do not drink alcohol if: Your health care provider tells you not to drink. You are pregnant, may be pregnant, or are planning to become  pregnant. If you drink alcohol: Limit how  much you have to: 0-1 drink a day for women 0-2 drinks a day for men. Know how much alcohol is in your drink. In the [place] one drink equals one 12 oz bottle of beer (355 mL), one 5 oz glass of wine (148 mL), or one 1 oz glass of hard liquor (44 mL). Activity Exercise regularly and spend time outdoors. Find activities that you enjoy and make time to do them. Find healthy ways to manage stress, such as: Meditation or deep breathing. Spending time in nature. Journaling. Return to your normal activities as told by your health care provider. Ask your health care provider what activities are safe for you. General instructions  Take over-the-counter and prescription medicines only as told by your health care provider. Discuss alcohol use with your health care provider. Alcohol can affect any antidepressant medicines you are taking. Discuss any drug use with your health care provider. Eat a healthy diet and get enough sleep. Consider joining a support group. Your health care provider may be able to recommend one. Keep all follow-up visits. It is important for your health care provider to check on your mood, behavior, and medicines. Your health care provider will make changes to your treatment as needed. Where to find more information [employer] on Mental Illness: nami.[employer] of Mental Health: bloggercourse.com American Psychiatric Association: psychiatry.org Contact a health care provider if: Your symptoms get worse. You develop new symptoms. Get help right away if: You hurt yourself on purpose (self-harm). You have thoughts about hurting yourself or others. You have hallucinations. Get help right away if you feel like you may hurt yourself or others, or have thoughts about taking your own life. Go to your nearest emergency room or: Call 911. Call the National Suicide Prevention Lifeline at 684-734-4247 or 988. This is open 24  hours a day. Text the Crisis Text Line at (832)325-8794. This information is not intended to replace advice given to you by your health care provider. Make sure you discuss any questions you have with your health care provider. Document Revised: 12/22/2021 Document Reviewed: 12/22/2021 Elsevier Patient Education  2024 [employer].

## 2023-09-12 NOTE — Progress Notes (Signed)
 Subjective:  Patient ID: Leon Carroll, male    DOB: 07-24-1976  Age: 48 y.o. MRN: [id number]  CC: Hypertension and Depression   HPI Blair Lundeen Evola presents for f/up ----  Discussed the use of AI scribe software for clinical note transcription with the patient, who gave verbal consent to proceed.  History of Present Illness   The patient, with a history of hypertension and currently on Paxil  for an unspecified condition, reports a recent onset of jaw clenching. This symptom, which began approximately a month ago, is most noticeable in the afternoon and evening, causing the jaw to feel tired by the end of the day. The patient denies any significant emotional stressors that could be contributing to this symptom. He also denies any symptoms of snoring or sleep apnea.  In addition to the jaw clenching, the patient reports feeling tired at the end of the day and a lack of energy. He also expresses dissatisfaction with recent weight gain, which he attributes to the initiation of Paxil .  The patient denies any feelings of weakness, dizziness, or lightheadedness, despite a low blood pressure reading. He also denies any thoughts of self-harm or harm to others. 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  The patient has been stepping down on the Paxil  as previously discussed with the doctor, but due to the jaw clenching, he was advised to increase the dose again. Despite this, the jaw clenching has persisted. The patient is unsure if a medication given for stomach spasms could be contributing to the jaw clenching.  The patient is also on a blood pressure medication, which he is willing to stop due to the low blood pressure readings. He has noticed a significant weight gain since starting Paxil , which he is not pleased with.       Outpatient Medications Prior to Visit  Medication Sig  Dispense Refill   Eszopiclone  3 MG TABS Take 1 tablet (3 mg total) by mouth at bedtime. Take immediately before bedtime 90 tablet 0   hyoscyamine  (LEVBID ) 0.375 MG 12 hr tablet Take 1 tablet (0.375 mg total) by mouth 2 (two) times daily. 180 tablet 0   irbesartan  (AVAPRO ) 150 MG tablet Take 1 tablet (150 mg total) by mouth daily. 90 tablet 1   tadalafil  (CIALIS ) 20 MG tablet Take 1 tablet (20 mg total) by mouth daily as needed for erectile dysfunction. 10 tablet 5   valACYclovir  (VALTREX ) 1000 MG tablet Take 1 tablet (1,000 mg total) by mouth 2 (two) times daily. (Patient taking differently: Take 1,000 mg by mouth as needed.) 90 tablet 0   PARoxetine  (PAXIL  CR) 25 MG 24 hr tablet Take 1 tablet (25 mg total) by mouth daily. 90 tablet 0   meloxicam  (MOBIC ) 15 MG tablet Take 1 tablet (15 mg total) by mouth daily. 14 tablet 0   methocarbamol  (ROBAXIN ) 500 MG tablet Take 1 tablet (500 mg total) by mouth every 8 (eight) hours as needed for muscle spasms. 30 tablet 0   No facility-administered medications prior to visit.    ROS Review of Systems  Constitutional:  Positive for fatigue and unexpected weight change (wt gain). Negative for appetite change, chills, diaphoresis and fever.  HENT: Negative.    Eyes: Negative.   Respiratory:  Negative for cough, chest tightness, shortness of breath and wheezing.   Cardiovascular:  Negative for chest pain, palpitations and leg swelling.  Gastrointestinal:  Negative.  Negative for abdominal pain, constipation, diarrhea, nausea and vomiting.  Endocrine: Negative.   Genitourinary: Negative.  Negative for difficulty urinating.  Musculoskeletal: Negative.   Skin: Negative.   Neurological: Negative.  Negative for dizziness and weakness.  Hematological:  Negative for adenopathy. Does not bruise/bleed easily.  Psychiatric/Behavioral:  Positive for dysphoric mood. Negative for agitation, behavioral problems, confusion, decreased concentration, sleep disturbance and  suicidal ideas. The patient is nervous/anxious.     Objective:  BP 130/86 (BP Location: Left Arm, Patient Position: Sitting, Cuff Size: Large)   Pulse 78   Temp 97.9 F (36.6 C) (Oral)   Resp 16   Ht 6' 1 (1.854 m)   Wt 253 lb 3.2 oz (114.9 kg)   SpO2 93%   BMI 33.41 kg/m   BP Readings from Last 3 Encounters:  09/12/23 130/86  07/26/23 130/86  07/14/23 118/82    Wt Readings from Last 3 Encounters:  09/12/23 253 lb 3.2 oz (114.9 kg)  07/26/23 249 lb 3.2 oz (113 kg)  07/14/23 242 lb (109.8 kg)    Physical Exam Vitals reviewed.  Constitutional:      Appearance: Normal appearance.  HENT:     Nose: Nose normal.     Mouth/Throat:     Mouth: Mucous membranes are moist.  Eyes:     General: No scleral icterus. Cardiovascular:     Rate and Rhythm: Normal rate and regular rhythm.     Heart sounds: No murmur heard. Pulmonary:     Effort: Pulmonary effort is normal.     Breath sounds: No stridor. No wheezing, rhonchi or rales.  Abdominal:     General: Abdomen is flat.     Palpations: There is no mass.     Tenderness: There is no abdominal tenderness. There is no guarding.     Hernia: No hernia is present.  Musculoskeletal:        General: Normal range of motion.     Cervical back: Neck supple.     Right lower leg: No edema.     Left lower leg: No edema.  Lymphadenopathy:     Cervical: No cervical adenopathy.  Skin:    General: Skin is warm and dry.  Neurological:     General: No focal deficit present.     Mental Status: He is alert and oriented to person, place, and time. Mental status is at baseline.  Psychiatric:        Mood and Affect: Mood normal.        Behavior: Behavior normal.        Thought Content: Thought content normal.        Judgment: Judgment normal.     Lab Results  Component Value Date   WBC 5.2 07/26/2023   HGB 14.6 07/26/2023   HCT 43.0 07/26/2023   PLT 220.0 07/26/2023   GLUCOSE 98 07/26/2023   CHOL 252 (H) 07/26/2023   TRIG 201.0  (H) 07/26/2023   HDL 37.40 (L) 07/26/2023   LDLDIRECT 173.0 08/02/2013   LDLCALC 175 (H) 07/26/2023   ALT 29 07/26/2023   AST 20 07/26/2023   NA 137 07/26/2023   K 4.0 07/26/2023   CL 102 07/26/2023   CREATININE 1.27 07/26/2023   BUN 15 07/26/2023   CO2 29 07/26/2023   TSH 1.70 07/26/2023   PSA 0.41 01/27/2022    No results found.  Assessment & Plan:   Essential hypertension, benign- His BP is well controlled.  B12 deficiency -  Cyanocobalamin   Insomnia due to psychological stress  Recurrent major depressive disorder, in partial remission (HCC)- [facility] testing indicates vortioxetine  is a better option than paroxetine . Will slowly make the transition. -     Vortioxetine  HBr; Take 1 tablet (5 mg total) by mouth daily.  Dispense: 30 tablet; Refill: 0 -     PARoxetine  HCl ER; Take 1 tablet (12.5 mg total) by mouth daily.  Dispense: 30 tablet; Refill: 0     Follow-up: Return in about 3 months (around 12/11/2023).  Debby Molt, MD

## 2023-09-16 ENCOUNTER — Encounter: Payer: Self-pay | Admitting: Internal Medicine

## 2023-09-21 NOTE — Telephone Encounter (Signed)
Copied from CRM [id number]. Topic: General - Other >> Sep 21, 2023 10:32 AM Corin V wrote: Reason for CRM: Clydie Braun called to verify gene paperwork with office. She was unable to provide a third identifier for patient but asked that someone give her a call back from the office to discuss paperwork. Advised that we do need a third identifier for the patient. Clydie [phone] Cover My Test

## 2023-10-08 ENCOUNTER — Other Ambulatory Visit: Payer: Self-pay | Admitting: Internal Medicine

## 2023-10-08 DIAGNOSIS — F3341 Major depressive disorder, recurrent, in partial remission: Secondary | ICD-10-CM

## 2023-10-09 ENCOUNTER — Other Ambulatory Visit: Payer: Self-pay | Admitting: Internal Medicine

## 2023-10-09 DIAGNOSIS — F3341 Major depressive disorder, recurrent, in partial remission: Secondary | ICD-10-CM

## 2023-10-09 MED ORDER — VORTIOXETINE HBR 10 MG PO TABS
10.0000 mg | ORAL_TABLET | Freq: Every day | ORAL | 0 refills | Status: DC
Start: 2023-10-09 — End: 2023-10-17

## 2023-10-13 ENCOUNTER — Ambulatory Visit (INDEPENDENT_AMBULATORY_CARE_PROVIDER_SITE_OTHER): Payer: 59

## 2023-10-13 DIAGNOSIS — E538 Deficiency of other specified B group vitamins: Secondary | ICD-10-CM | POA: Diagnosis not present

## 2023-10-13 MED ORDER — CYANOCOBALAMIN 1000 MCG/ML IJ SOLN
1000.0000 ug | Freq: Once | INTRAMUSCULAR | Status: AC
  Administered 2023-10-13: 1000 ug via INTRAMUSCULAR

## 2023-10-13 NOTE — Progress Notes (Signed)
Patient presents in office today for monthly b12 injection. Tolerated injection well, will scheduled for next injection.

## 2023-10-14 ENCOUNTER — Encounter: Payer: Self-pay | Admitting: Nurse Practitioner

## 2023-10-14 ENCOUNTER — Other Ambulatory Visit (INDEPENDENT_AMBULATORY_CARE_PROVIDER_SITE_OTHER): Payer: 59

## 2023-10-14 ENCOUNTER — Ambulatory Visit: Payer: 59 | Admitting: Nurse Practitioner

## 2023-10-14 ENCOUNTER — Other Ambulatory Visit: Payer: Self-pay

## 2023-10-14 ENCOUNTER — Ambulatory Visit: Payer: 59

## 2023-10-14 VITALS — BP 114/78 | HR 76 | Ht 73.0 in | Wt 257.0 lb

## 2023-10-14 DIAGNOSIS — K5909 Other constipation: Secondary | ICD-10-CM

## 2023-10-14 DIAGNOSIS — E538 Deficiency of other specified B group vitamins: Secondary | ICD-10-CM

## 2023-10-14 DIAGNOSIS — R17 Unspecified jaundice: Secondary | ICD-10-CM

## 2023-10-14 DIAGNOSIS — R1084 Generalized abdominal pain: Secondary | ICD-10-CM

## 2023-10-14 DIAGNOSIS — Z8719 Personal history of other diseases of the digestive system: Secondary | ICD-10-CM | POA: Diagnosis not present

## 2023-10-14 DIAGNOSIS — K529 Noninfective gastroenteritis and colitis, unspecified: Secondary | ICD-10-CM | POA: Diagnosis not present

## 2023-10-14 LAB — CBC WITH DIFFERENTIAL/PLATELET
Basophils Absolute: 0 10*3/uL (ref 0.0–0.1)
Basophils Relative: 0.8 % (ref 0.0–3.0)
Eosinophils Absolute: 0.2 10*3/uL (ref 0.0–0.7)
Eosinophils Relative: 4.1 % (ref 0.0–5.0)
HCT: 45.3 % (ref 39.0–52.0)
Hemoglobin: 15.7 g/dL (ref 13.0–17.0)
Lymphocytes Relative: 44.4 % (ref 12.0–46.0)
Lymphs Abs: 2.2 10*3/uL (ref 0.7–4.0)
MCHC: 34.7 g/dL (ref 30.0–36.0)
MCV: 91.8 fL (ref 78.0–100.0)
Monocytes Absolute: 0.4 10*3/uL (ref 0.1–1.0)
Monocytes Relative: 8.2 % (ref 3.0–12.0)
Neutro Abs: 2.1 10*3/uL (ref 1.4–7.7)
Neutrophils Relative %: 42.5 % — ABNORMAL LOW (ref 43.0–77.0)
Platelets: 225 10*3/uL (ref 150.0–400.0)
RBC: 4.94 Mil/uL (ref 4.22–5.81)
RDW: 13.3 % (ref 11.5–15.5)
WBC: 5 10*3/uL (ref 4.0–10.5)

## 2023-10-14 LAB — COMPREHENSIVE METABOLIC PANEL
ALT: 20 U/L (ref 0–53)
AST: 18 U/L (ref 0–37)
Albumin: 4.4 g/dL (ref 3.5–5.2)
Alkaline Phosphatase: 45 U/L (ref 39–117)
BUN: 12 mg/dL (ref 6–23)
CO2: 30 meq/L (ref 19–32)
Calcium: 9.2 mg/dL (ref 8.4–10.5)
Chloride: 101 meq/L (ref 96–112)
Creatinine, Ser: 1.17 mg/dL (ref 0.40–1.50)
GFR: 74.46 mL/min (ref >60.00)
Glucose, Bld: 90 mg/dL (ref 70–99)
Potassium: 4.4 meq/L (ref 3.5–5.1)
Sodium: 135 meq/L (ref 135–145)
Total Bilirubin: 1.5 mg/dL — ABNORMAL HIGH (ref 0.2–1.2)
Total Protein: 7.4 g/dL (ref 6.0–8.3)

## 2023-10-14 LAB — C-REACTIVE PROTEIN: CRP: <1 mg/dL (ref 0.5–20.0)

## 2023-10-14 LAB — BILIRUBIN, DIRECT: Bilirubin, Direct: 0.2 mg/dL (ref 0.0–0.3)

## 2023-10-14 LAB — SEDIMENTATION RATE: Sed Rate: 9 mm/h (ref 0–15)

## 2023-10-14 MED ORDER — SUFLAVE 178.7 G PO SOLR: 1.0000 | Freq: Once | ORAL | 0 refills | Status: AC

## 2023-10-14 NOTE — Progress Notes (Signed)
Endoscopy has been added along with the colonoscopy. Patient is still set for 12/02/23 at 2:30 pm.

## 2023-10-14 NOTE — Progress Notes (Signed)
Attending Physician's Attestation   I have reviewed the chart.   I agree with the Advanced Practitioner's note, impression, and recommendations with any updates as below. Very reasonable for patient to undergo screening colonoscopy.  Agree with additional workup as outlined.  With history of previous B12 deficiency, I think an upper endoscopy to rule out AMAG/pernicious anemia is not unreasonable.  Performing the upper endoscopy will also help our general surgeons considered the potential role of cholecystectomy in the future.  If the patient is willing to move forward with endoscopy, I think I would go ahead and schedule it as a double procedure even if the patient does not have evidence of celiac positive serologies.  If patient wants to just pursue colonoscopy, that is certainly okay as well.  Corliss Parish, MD [facility] Advanced Endoscopy Office # [phone]

## 2023-10-14 NOTE — Progress Notes (Signed)
DD, I spoke with patient and discussed Dr. Elesa Hacker recommendations regarding adding an EGD to his colonoscopy date. I explained in full detail to patient and he consents to proceeding with both EGD and colonoscopy. Pls add EGD to his colonoscopy date.

## 2023-10-14 NOTE — Patient Instructions (Addendum)
You have been scheduled for a colonoscopy. Please follow written instructions given to you at your visit today.   If you use inhalers (even only as needed), please bring them with you on the day of your procedure. ____________________________________________  Bonita Quin will receive your bowel preparation through [facility], which ensures the lowest copay and home delivery, with outreach via text or call from an [phone] number. Please respond promptly to avoid rescheduling of your procedure. If you are interested in alternative options or have any questions regarding your prep, please contact them at [phone] ____________________________________________________________________________  Your Provider Has Sent Your Bowel Prep Regimen To [facility]   [facility] will contact you to verify your information and collect your copay, if applicable. Enjoy the comfort of your home while your prescription is mailed to you, FREE of any shipping charges.   [facility] accepts all major insurance benefits and applies discounts & coupons.  Have additional questions?   Chat: [email or web address] Call: [phone] Email: [email or web address] NCPDP: [id number]  How will [facility] contact you?  With a Welcome phone call,  a Welcome text and a checkout link in text form.  Texts you receive from [phone] Are NOT Spam.  *To set up delivery, you must complete the checkout process via link or speak to one of the patient care representatives. If [facility] is unable to reach you, your prescription may be delayed.  To avoid long hold times on the phone, you may also utilize the secure chat feature on the [facility] website to request that they call you back for transaction completion or to expedite your concerns.   Your provider has requested that you go to the basement level for lab work before leaving today. Press "B" on the elevator. The lab is located at the first door on the left as you exit the  elevator.  Benefiber- take 1 tablespoon daily to regulate bowels as tolerated  Due to recent changes in healthcare laws, you may see the results of your imaging and laboratory studies on MyChart before your provider has had a chance to review them.  We understand that in some cases there may be results that are confusing or concerning to you. Not all laboratory results come back in the same time frame and the provider may be waiting for multiple results in order to interpret others.  Please give [place] 48 hours in order for your provider to thoroughly review all the results before contacting the office for clarification of your results.   Thank you for trusting me with your gastrointestinal care!   Alcide Evener, CRNP

## 2023-10-14 NOTE — Progress Notes (Signed)
10/14/2023 Leon Carroll [id number] 1975-11-10   CHIEF COMPLAINT: Abdominal pain, irregular bowel pattern   HISTORY OF PRESENT ILLNESS: Leon Carroll. Fess is a 48 year old male with a past medical history of ADHD, anxiety, hypertension, vitamin D deficiency, vitamin B 12 deficiency, vitamin D deficiency and gallstones. Tonsillectomy and adenoidectomy age 87. He presents to our office today as referred by Dr. Johney Frame to schedule a screening colonoscopy. He describes having an irregular bowl pattern all of his life. He goes 3 to 5 days without passing a BM then passes multiple solid then loose to watery stools for one or two days then the cycle repeats. No bloody or black stools. He has generalized abdominal pain for the past year which occurs comes and goes daily, is not constant. Fatty/fried foods or ice cream possibly worsen his pain.  Hyoscyamine was ineffective.  He took a probiotic for one or two days then stopped it as he wasn't sure if he should take it. His PCP prescribed Linzess in the past but he did not wish to take it. He has lower back pain for the past few months, initially thought it was due to playing tennis. He was seen by an orthopedist who prescribed Meloxicam and Prednisone which he took simultaneously x 5 days without improvement. He denies ever having a colonoscopy. His step father died from colon cancer at the age of 7. Mother with history of colitis, further details are unclear. He denies having any N/V. No heartburn or trouble swallowing.  No known family history of celiac disease, IBD or colorectal cancer.  He stated having a history of gallstones which was identified when he presented to the ED 8/08//2023 with atypical chest pain.  RUQ sonogram and CTAP showed cholelithiasis without evidence of acute cholecystitis and no biliary ductal dilatation.  Total bilirubin level was elevated 2.6 with direct bili 0.3. Indirect bili 2.1.  Alk phos 40.  AST 16.  ALT 12.  His atypical  chest pain was thought possibly secondary to gallstones.  He was referred to general surgery as an outpatient. He initially saw general surgeon Dr. Gaynelle Adu 08/20/2022 to consider a cholecystectomy which the did not wish to pursue at that time as he had issues with his health insurance. He was recently seen by his PCP who referred him back to Dr. Andrey Campanile to further determine if an elective cholecystectomy is warranted.   He has a history of vitamin B12 deficiency, restarted monthly B12 injections 2 months ago.     Latest Ref Rng & Units 07/26/2023    4:45 PM 11/10/2022    3:19 PM 01/27/2022    8:51 AM  CBC  WBC 4.0 - 10.5 K/uL 5.2  5.1  5.0   Hemoglobin 13.0 - 17.0 g/dL 95.2  84.1  32.4   Hematocrit 39.0 - 52.0 % 43.0  46.3  43.1   Platelets 150.0 - 400.0 K/uL 220.0  221.0  211.0        Latest Ref Rng & Units 07/26/2023    4:45 PM 11/10/2022    3:19 PM 01/27/2022    8:51 AM  CMP  Glucose 70 - 99 mg/dL 98  64  97   BUN 6 - 23 mg/dL 15  14  12    Creatinine 0.40 - 1.50 mg/dL 4.01  0.27  2.53   Sodium 135 - 145 mEq/L 137  138  137   Potassium 3.5 - 5.1 mEq/L 4.0  3.8  4.3   Chloride  96 - 112 mEq/L 102  101  101   CO2 19 - 32 mEq/L 29  30  28    Calcium 8.4 - 10.5 mg/dL 9.2  9.5  9.3   Total Protein 6.0 - 8.3 g/dL 7.0   6.6   Total Bilirubin 0.2 - 1.2 mg/dL 1.1   1.2   Alkaline Phos 39 - 117 U/L 45   41   AST 0 - 37 U/L 20   14   ALT 0 - 53 U/L 29   12   TSH 1.70.  RUQ sonogram 04/06/2022:  FINDINGS:  Gallbladder: No wall thickening visualized. Numerous gallstones, largest measures  1.7 cm. No sonographic Murphy sign noted by sonographer.   Common bile duct: Diameter: 2.1 mm   Liver: No focal lesion identified. Within normal limits in parenchymal  echogenicity. Portal vein is patent on color Doppler imaging with  normal direction of blood flow towards the liver.   IMPRESSION:  Cholelithiasis with no evidence of acute cholecystitis.   CTAP with contrast  03/2022: Hepatobiliary: Moderate cholesterol gallstones. Liver and biliary  tree are normal.   Pancreas: Normal.   Spleen: Normal.   Adrenals/Urinary Tract: Adrenal glands are normal. Kidneys are  normal in size without hydronephrosis. Single bilateral punctate  nonobstructing renal stones. Bilateral renal cysts with the largest  over the lower pole left kidney measuring 3.4 cm for which no  follow-up is recommended. Ureters and bladder are normal..   Stomach/Bowel: Stomach and small bowel are normal. Appendix is  normal. Colon is normal.   Vascular/Lymphatic: Abdominal aorta is normal caliber. No  adenopathy.   Reproductive: Normal.   Other: No free fluid or focal inflammatory change.   Musculoskeletal: Several Schmorl's nodes over the lumbar spine. No  acute abnormality.   IMPRESSION:  1. No acute findings in the chest, abdomen, or pelvis.  2. Cholelithiasis.  3. Minimal bilateral nonobstructing nephrolithiasis.  4. Bilateral renal cysts with the largest over the lower pole left  kidney measuring 3.4 cm for which no follow-up is recommended.   Past Medical History:  Diagnosis Date   ADHD (attention deficit hyperactivity disorder), inattentive type 08/02/2013   Essential hypertension, benign 08/02/2013   Hyperlipidemia with target LDL less than 130 09/19/2015   Vitamin D deficiency 01/28/2016   Past surgical history: Tonsillectomy and adenoidectomy at the age of 24, he reported having potential complication with anesthesia as he remained in the hospital for several days post operatively, further details unknown.  Social History: Divorced.  He has 2 sons and 1 daughter.  He is a P[employer] consultant.  Non-smoker.  No alcohol use.  No drug use.   Family History: No known biological family history of colon cancer.  Father with hypertension and bladder cancer.  Mother with history of colitis, further details are unclear.  Allergies  Allergen Reactions   Vyvanse  [Lisdexamfetamine Dimesylate] Other (See Comments)    Clinching teeth   Azithromycin Nausea And Vomiting      Outpatient Encounter Medications as of 10/14/2023  Medication Sig   Eszopiclone 3 MG TABS Take 1 tablet (3 mg total) by mouth at bedtime. Take immediately before bedtime   hyoscyamine (LEVBID) 0.375 MG 12 hr tablet Take 1 tablet (0.375 mg total) by mouth 2 (two) times daily.   irbesartan (AVAPRO) 150 MG tablet Take 1 tablet (150 mg total) by mouth daily.   tadalafil (CIALIS) 20 MG tablet Take 1 tablet (20 mg total) by mouth daily as needed for erectile  dysfunction.   valACYclovir (VALTREX) 1000 MG tablet Take 1 tablet (1,000 mg total) by mouth 2 (two) times daily. (Patient taking differently: Take 1,000 mg by mouth as needed.)   vortioxetine HBr (TRINTELLIX) 10 MG TABS tablet Take 1 tablet (10 mg total) by mouth daily.   No facility-administered encounter medications on file as of 10/14/2023.   REVIEW OF SYSTEMS:  Gen: Denies fever, sweats or chills. + Weight gain.  CV: Denies chest pain, palpitations or edema. Resp: Denies cough, shortness of breath of hemoptysis.  GI: See HPI.  GU: Denies urinary burning, blood in urine, increased urinary frequency or incontinence. MS: Denies joint pain, muscles aches or weakness. Derm: Denies rash, itchiness, skin lesions or unhealing ulcers. Psych: Denies depression, anxiety, memory loss or confusion. Heme: Denies bruising, easy bleeding. Neuro:  Denies headaches, dizziness or paresthesias. Endo:  Denies any problems with DM, thyroid or adrenal function.  PHYSICAL EXAM: There were no vitals taken for this visit. BP 114/78   Pulse 76   Ht 6\' 1"  (1.854 m)   Wt 257 lb (116.6 kg)   SpO2 97%   BMI 33.91 kg/m  Wt Readings from Last 3 Encounters:  10/14/23 257 lb (116.6 kg)  09/12/23 253 lb 3.2 oz (114.9 kg)  07/26/23 249 lb 3.2 oz (113 kg)    General: 48 year old male in no acute distress. Head: Normocephalic and atraumatic. Eyes:   Sclerae non-icteric, conjunctive pink. Ears: Normal auditory acuity. Mouth: Dentition intact. No ulcers or lesions.  Neck: Supple, no lymphadenopathy or thyromegaly.  Lungs: Clear bilaterally to auscultation without wheezes, crackles or rhonchi. Heart: Regular rate and rhythm. No murmur, rub or gallop appreciated.  Abdomen: Soft, nontender, nondistended. No masses. No hepatosplenomegaly. Normoactive bowel sounds x 4 quadrants.  Rectal: Deferred.  Musculoskeletal: + Lower back pain. Skin: Warm and dry. No rash or lesions on visible extremities. Extremities: No edema. Neurological: Alert oriented x 4, no focal deficits.  Psychological:  Alert and cooperative. Normal mood and affect.  ASSESSMENT AND PLAN:  48 year old male presents to schedule a screening colonoscopy.  No biological family history of colon cancer. -Colonoscopy benefits and risks discussed including risk with sedation, risk of bleeding, perforation and infection   Altered bowel pattern, chronic constipation x 3 to 5 days followed by passing numerous solid to loose/watery nonbloody bowel movements for 1 to 2 days then the cycle repeats.  Mother with history of colitis, further details unclear at this time. -Colonoscopy as ordered above to include random colon biopsies to rule out microscopic colitis/IBD -Benefiber 1 tablespoon daily as tolerated -Patient will attempt to verify what type of colitis his mother was previously diagnosed with   Generalized abdominal pain, occurs daily but comes and goes for the past year.  Negative abdominal exam at this time. -CBC, CMP, CRP, sed rate, TTG and IgA -Consider CTAP if generalized abdominal pain worsens -Patient to contact office if abdominal pain worsens, instructed to go to the ED if he develops severe abdominal pain  History of gallstones without evidence of acute cholecystitis or biliary ductal dilatation per RUQ sono and CTAP 03/2022.  -Patient is scheduled to see general surgeon  Dr. Gaynelle Adu next week  Vitamin B12 deficiency, recently restarted monthly B12 injections -Continue follow-up with PCP     CC:  Etta Grandchild, MD

## 2023-10-14 NOTE — Addendum Note (Signed)
Addended by: Rise Paganini on: 10/14/2023 03:05 PM   Modules accepted: Orders

## 2023-10-15 ENCOUNTER — Other Ambulatory Visit: Payer: Self-pay | Admitting: Internal Medicine

## 2023-10-15 LAB — IGA: Immunoglobulin A: 230 mg/dL (ref 47–310)

## 2023-10-15 LAB — TISSUE TRANSGLUTAMINASE ABS,IGG,IGA
(tTG) Ab, IgA: <1 U/mL
(tTG) Ab, IgG: <1 U/mL

## 2023-10-17 ENCOUNTER — Other Ambulatory Visit: Payer: Self-pay | Admitting: Internal Medicine

## 2023-10-17 DIAGNOSIS — F3341 Major depressive disorder, recurrent, in partial remission: Secondary | ICD-10-CM

## 2023-10-17 MED ORDER — VORTIOXETINE HBR 20 MG PO TABS
20.0000 mg | ORAL_TABLET | Freq: Every day | ORAL | 0 refills | Status: DC
Start: 2023-10-17 — End: 2024-01-16

## 2023-10-18 ENCOUNTER — Other Ambulatory Visit: Payer: Self-pay | Admitting: Internal Medicine

## 2023-10-18 DIAGNOSIS — F900 Attention-deficit hyperactivity disorder, predominantly inattentive type: Secondary | ICD-10-CM

## 2023-10-18 MED ORDER — VILOXAZINE HCL ER 150 MG PO CP24
150.0000 mg | ORAL_CAPSULE | Freq: Every day | ORAL | 0 refills | Status: DC
Start: 2023-10-18 — End: 2023-10-21

## 2023-10-20 ENCOUNTER — Other Ambulatory Visit (HOSPITAL_COMMUNITY): Payer: Self-pay

## 2023-10-20 ENCOUNTER — Telehealth: Payer: Self-pay | Admitting: Pharmacy Technician

## 2023-10-20 NOTE — Telephone Encounter (Signed)
 Pharmacy Patient Advocate Encounter  Received notification from [facility] that Prior Authorization for [facility] 150MG  CAPSULES has been DENIED.  Full denial letter will be uploaded to the media tab. See denial reason below.   PA #/Case ID/Reference #: [id number]

## 2023-10-20 NOTE — Telephone Encounter (Signed)
 Pharmacy Patient Advocate Encounter   Received notification from CoverMyMeds that prior authorization for [facility] 150MG  CAPSULES is required/requested.   Insurance verification completed.   The patient is insured through [employer] .   Per test claim: PA required; PA submitted to above mentioned insurance via CoverMyMeds Key/confirmation #/EOC [id number] Status is pending

## 2023-10-21 ENCOUNTER — Other Ambulatory Visit: Payer: Self-pay | Admitting: Internal Medicine

## 2023-10-21 DIAGNOSIS — F900 Attention-deficit hyperactivity disorder, predominantly inattentive type: Secondary | ICD-10-CM

## 2023-10-21 MED ORDER — ATOMOXETINE HCL 25 MG PO CAPS: 25.0000 mg | ORAL_CAPSULE | Freq: Every day | ORAL | 0 refills | Status: DC

## 2023-10-24 NOTE — Telephone Encounter (Signed)
 Patient has been made aware and Dr. Yetta Barre has since prescribed him something else that is covered

## 2023-10-25 ENCOUNTER — Other Ambulatory Visit: Payer: Self-pay | Admitting: Internal Medicine

## 2023-10-25 DIAGNOSIS — F900 Attention-deficit hyperactivity disorder, predominantly inattentive type: Secondary | ICD-10-CM

## 2023-10-26 ENCOUNTER — Telehealth: Payer: Self-pay | Admitting: Sports Medicine

## 2023-10-26 ENCOUNTER — Other Ambulatory Visit: Payer: Self-pay | Admitting: Sports Medicine

## 2023-10-26 MED ORDER — METHYLPREDNISOLONE 4 MG PO TBPK: ORAL_TABLET | ORAL | 0 refills | Status: DC

## 2023-10-26 MED ORDER — MELOXICAM 15 MG PO TABS: 15.0000 mg | ORAL_TABLET | Freq: Every day | ORAL | 0 refills | Status: DC

## 2023-10-26 NOTE — Telephone Encounter (Signed)
 Patient called to state that the Meloxicam and Prednisone medication he took after his visit here helped to get rid of his pain in his back but that he is having a lot of pain in his lower back again-exact thing he came here for last. Patient is asking about getting the medication again.

## 2023-10-27 ENCOUNTER — Other Ambulatory Visit: Payer: Self-pay | Admitting: Internal Medicine

## 2023-10-27 DIAGNOSIS — F900 Attention-deficit hyperactivity disorder, predominantly inattentive type: Secondary | ICD-10-CM

## 2023-10-27 MED ORDER — ATOMOXETINE HCL 40 MG PO CAPS: 40.0000 mg | ORAL_CAPSULE | Freq: Every day | ORAL | 0 refills | Status: DC

## 2023-11-02 ENCOUNTER — Other Ambulatory Visit: Payer: Self-pay | Admitting: Internal Medicine

## 2023-11-02 DIAGNOSIS — F5102 Adjustment insomnia: Secondary | ICD-10-CM

## 2023-11-03 MED ORDER — ESZOPICLONE 3 MG PO TABS: 3.0000 mg | ORAL_TABLET | Freq: Every day | ORAL | 0 refills | Status: DC

## 2023-11-24 ENCOUNTER — Encounter: Payer: Self-pay | Admitting: Gastroenterology

## 2023-11-27 ENCOUNTER — Encounter: Payer: Self-pay | Admitting: Certified Registered Nurse Anesthetist

## 2023-12-02 ENCOUNTER — Ambulatory Visit: Payer: 59 | Admitting: Gastroenterology

## 2023-12-02 ENCOUNTER — Encounter: Payer: Self-pay | Admitting: Gastroenterology

## 2023-12-02 ENCOUNTER — Encounter: Payer: 59 | Admitting: Gastroenterology

## 2023-12-02 VITALS — BP 118/76 | HR 71 | Temp 98.2°F | Resp 11 | Ht 73.0 in | Wt 257.0 lb

## 2023-12-02 DIAGNOSIS — Z1211 Encounter for screening for malignant neoplasm of colon: Secondary | ICD-10-CM

## 2023-12-02 DIAGNOSIS — E538 Deficiency of other specified B group vitamins: Secondary | ICD-10-CM | POA: Diagnosis not present

## 2023-12-02 DIAGNOSIS — K529 Noninfective gastroenteritis and colitis, unspecified: Secondary | ICD-10-CM

## 2023-12-02 DIAGNOSIS — K641 Second degree hemorrhoids: Secondary | ICD-10-CM | POA: Diagnosis not present

## 2023-12-02 DIAGNOSIS — K3189 Other diseases of stomach and duodenum: Secondary | ICD-10-CM | POA: Diagnosis not present

## 2023-12-02 DIAGNOSIS — K2289 Other specified disease of esophagus: Secondary | ICD-10-CM | POA: Diagnosis not present

## 2023-12-02 DIAGNOSIS — D128 Benign neoplasm of rectum: Secondary | ICD-10-CM

## 2023-12-02 DIAGNOSIS — K621 Rectal polyp: Secondary | ICD-10-CM | POA: Diagnosis not present

## 2023-12-02 DIAGNOSIS — R1084 Generalized abdominal pain: Secondary | ICD-10-CM

## 2023-12-02 MED ORDER — SODIUM CHLORIDE 0.9 % IV SOLN: 500.0000 mL | INTRAVENOUS | Status: DC

## 2023-12-02 NOTE — Progress Notes (Signed)
 GASTROENTEROLOGY PROCEDURE H&P NOTE   Primary Care Physician: Etta Grandchild, MD  HPI: Leon Carroll is a 48 y.o. male who presents for EGD/Colonoscopy for evaluation of B12 deficiency and rule out AMAG/Pernicious anemia and for Colon Cancer screening and rule out microscopic colitis.  Past Medical History:  Diagnosis Date   ADHD (attention deficit hyperactivity disorder), inattentive type 08/02/2013   Essential hypertension, benign 08/02/2013   Gall stones    Hyperlipidemia with target LDL less than 130 09/19/2015   Vitamin D deficiency 01/28/2016   No past surgical history on file. Current Outpatient Medications  Medication Sig Dispense Refill   atomoxetine (STRATTERA) 40 MG capsule Take 1 capsule (40 mg total) by mouth daily. 90 capsule 0   Eszopiclone 3 MG TABS Take 1 tablet (3 mg total) by mouth at bedtime. Take immediately before bedtime 90 tablet 0   hyoscyamine (LEVBID) 0.375 MG 12 hr tablet Take 1 tablet (0.375 mg total) by mouth 2 (two) times daily. 180 tablet 0   irbesartan (AVAPRO) 150 MG tablet Take 1 tablet (150 mg total) by mouth daily. 90 tablet 1   meloxicam (MOBIC) 15 MG tablet Take 1 tablet (15 mg total) by mouth daily. 30 tablet 0   methylPREDNISolone (MEDROL DOSEPAK) 4 MG TBPK tablet Take 6 tablets on day 1.  Take 5 tablets on day 2.  Take 4 tablets on day 3.  Take 3 tablets on day 4.  Take 2 tablets on day 5.  Take 1 tablet on day 6. 21 tablet 0   tadalafil (CIALIS) 20 MG tablet Take 1 tablet (20 mg total) by mouth daily as needed for erectile dysfunction. 10 tablet 5   valACYclovir (VALTREX) 1000 MG tablet Take 1 tablet (1,000 mg total) by mouth 2 (two) times daily. (Patient taking differently: Take 1,000 mg by mouth as needed.) 90 tablet 0   vortioxetine HBr (TRINTELLIX) 20 MG TABS tablet Take 1 tablet (20 mg total) by mouth daily. 90 tablet 0   Current Facility-Administered Medications  Medication Dose Route Frequency Provider Last Rate Last Admin    0.9 %  sodium chloride infusion  500 mL Intravenous Continuous Mansouraty, Netty Starring., MD        Current Outpatient Medications:    atomoxetine (STRATTERA) 40 MG capsule, Take 1 capsule (40 mg total) by mouth daily., Disp: 90 capsule, Rfl: 0   Eszopiclone 3 MG TABS, Take 1 tablet (3 mg total) by mouth at bedtime. Take immediately before bedtime, Disp: 90 tablet, Rfl: 0   hyoscyamine (LEVBID) 0.375 MG 12 hr tablet, Take 1 tablet (0.375 mg total) by mouth 2 (two) times daily., Disp: 180 tablet, Rfl: 0   irbesartan (AVAPRO) 150 MG tablet, Take 1 tablet (150 mg total) by mouth daily., Disp: 90 tablet, Rfl: 1   meloxicam (MOBIC) 15 MG tablet, Take 1 tablet (15 mg total) by mouth daily., Disp: 30 tablet, Rfl: 0   methylPREDNISolone (MEDROL DOSEPAK) 4 MG TBPK tablet, Take 6 tablets on day 1.  Take 5 tablets on day 2.  Take 4 tablets on day 3.  Take 3 tablets on day 4.  Take 2 tablets on day 5.  Take 1 tablet on day 6., Disp: 21 tablet, Rfl: 0   tadalafil (CIALIS) 20 MG tablet, Take 1 tablet (20 mg total) by mouth daily as needed for erectile dysfunction., Disp: 10 tablet, Rfl: 5   valACYclovir (VALTREX) 1000 MG tablet, Take 1 tablet (1,000 mg total) by mouth 2 (two) times daily. (  Patient taking differently: Take 1,000 mg by mouth as needed.), Disp: 90 tablet, Rfl: 0   vortioxetine HBr (TRINTELLIX) 20 MG TABS tablet, Take 1 tablet (20 mg total) by mouth daily., Disp: 90 tablet, Rfl: 0  Current Facility-Administered Medications:    0.9 %  sodium chloride infusion, 500 mL, Intravenous, Continuous, Mansouraty, Netty Starring., MD Allergies  Allergen Reactions   Vyvanse [Lisdexamfetamine Dimesylate] Other (See Comments)    Clinching teeth   Azithromycin Nausea And Vomiting    Patient unsure   Family History  Problem Relation Age of Onset   Other Mother        Multiple GI issues, unsure of dx.   Bladder Cancer Father    Hypertension Father    Stroke Neg Hx    Cancer Neg Hx    Diabetes Neg Hx    Early  death Neg Hx    Heart disease Neg Hx    Hyperlipidemia Neg Hx    Kidney disease Neg Hx    Arthritis Neg Hx    Social History   Socioeconomic History   Marital status: Legally Separated    Spouse name: Not on file   Number of children: Not on file   Years of education: Not on file   Highest education level: Not on file  Occupational History   Not on file  Tobacco Use   Smoking status: Never   Smokeless tobacco: Never  Vaping Use   Vaping status: Never Used  Substance and Sexual Activity   Alcohol use: Yes    Alcohol/week: 1.0 standard drink of alcohol    Types: 1 Cans of beer per week    Comment: Rarely   Drug use: No   Sexual activity: Yes  Other Topics Concern   Not on file  Social History Narrative   Not on file   Social Drivers of Health   Financial Resource Strain: Not on file  Food Insecurity: Not on file  Transportation Needs: Not on file  Physical Activity: Not on file  Stress: Not on file  Social Connections: Not on file  Intimate Partner Violence: Not on file    Physical Exam: Today's Vitals   12/02/23 1453  BP: 137/88  Pulse: 81  Temp: 98.2 F (36.8 C)  TempSrc: Temporal  SpO2: 97%  Weight: 257 lb (116.6 kg)  Height: 6\' 1"  (1.854 m)   Body mass index is 33.91 kg/m. GEN: NAD EYE: Sclerae anicteric ENT: MMM CV: Non-tachycardic GI: Soft, NT/ND NEURO:  Alert & Oriented x 3  Lab Results: No results for input(s): "WBC", "HGB", "HCT", "PLT" in the last 72 hours. BMET No results for input(s): "NA", "K", "CL", "CO2", "GLUCOSE", "BUN", "CREATININE", "CALCIUM" in the last 72 hours. LFT No results for input(s): "PROT", "ALBUMIN", "AST", "ALT", "ALKPHOS", "BILITOT", "BILIDIR", "IBILI" in the last 72 hours. PT/INR No results for input(s): "LABPROT", "INR" in the last 72 hours.   Impression / Plan: This is a 48 y.o.male who presents for EGD/Colonoscopy for evaluation of B12 deficiency and rule out AMAG/Pernicious anemia and for Colon Cancer  screening and rule out microscopic colitis.   The risks and benefits of endoscopic evaluation/treatment were discussed with the patient and/or family; these include but are not limited to the risk of perforation, infection, bleeding, missed lesions, lack of diagnosis, severe illness requiring hospitalization, as well as anesthesia and sedation related illnesses.  The patient's history has been reviewed, patient examined, no change in status, and deemed stable for procedure.  The patient and/or  family is agreeable to proceed.    Corliss Parish, MD  [facility] Advanced Endoscopy Office # [phone]

## 2023-12-02 NOTE — Op Note (Signed)
 [facility] Patient Name: Leon Carroll Procedure Date: 12/02/2023 3:37 PM MRN: [id number] Endoscopist: Corliss Parish , MD, [id number] Age: 48 Referring MD:  Date of Birth: [birth year] Gender: Male Account #: [id number] Procedure:                Upper GI endoscopy Indications:              Pernicious anemia, B12 deficiency rule out AMAG,                            Change in bowel habits Medicines:                Monitored Anesthesia Care Procedure:                Pre-Anesthesia Assessment:                           - Prior to the procedure, a History and Physical                            was performed, and patient medications and                            allergies were reviewed. The patient's tolerance of                            previous anesthesia was also reviewed. The risks                            and benefits of the procedure and the sedation                            options and risks were discussed with the patient.                            All questions were answered, and informed consent                            was obtained. Prior Anticoagulants: The patient has                            taken no anticoagulant or antiplatelet agents                            except for NSAID medication. ASA Grade Assessment:                            II - A patient with mild systemic disease. After                            reviewing the risks and benefits, the patient was                            deemed in satisfactory condition to undergo the  procedure.                           After obtaining informed consent, the endoscope was                            passed under direct vision. Throughout the                            procedure, the patient's blood pressure, pulse, and                            oxygen saturations were monitored continuously. The                            Olympus Scope SN [id number] was introduced through the                             mouth, and advanced to the second part of duodenum.                            The upper GI endoscopy was accomplished without                            difficulty. The patient tolerated the procedure. Scope In: Scope Out: Findings:                 No gross lesions were noted in the entire esophagus.                           The Z-line was irregular and was found 45 cm from                            the incisors.                           Patchy mildly erythematous mucosa without bleeding                            was found in the entire examined stomach. Biopsies                            were taken with a cold forceps for histology and                            Helicobacter pylori testing.                           No gross lesions were noted in the duodenal bulb,                            in the first portion of the duodenum and in the  second portion of the duodenum. Biopsies were taken                            with a cold forceps for histology. Complications:            No immediate complications. Estimated Blood Loss:     Estimated blood loss was minimal. Impression:               - No gross lesions in the entire esophagus.                           - Z-line irregular, 45 cm from the incisors.                           - Erythematous mucosa in the stomach. Biopsied.                           - No gross lesions in the duodenal bulb, in the                            first portion of the duodenum and in the second                            portion of the duodenum. Biopsied. Recommendation:           - Proceed to scheduled colonoscopy.                           - Continue present medications.                           - Await pathology results.                           - The findings and recommendations were discussed                            with the patient. Corliss Parish, MD 12/02/2023 4:13:42 PM

## 2023-12-02 NOTE — Progress Notes (Signed)
1542 Robinul 0.1 mg IV given due large amount of secretions upon assessment.  MD made aware, vss

## 2023-12-02 NOTE — Patient Instructions (Signed)
Discharge instructions given. Handouts on polyps and Hemorrhoids. Resume previous medications. YOU HAD AN ENDOSCOPIC PROCEDURE TODAY AT THE [facility]:   Refer to the procedure report that was given to you for any specific questions about what was found during the examination.  If the procedure report does not answer your questions, please call your gastroenterologist to clarify.  If you requested that your care partner not be given the details of your procedure findings, then the procedure report has been included in a sealed envelope for you to review at your convenience later.  YOU SHOULD EXPECT: Some feelings of bloating in the abdomen. Passage of more gas than usual.  Walking can help get rid of the air that was put into your GI tract during the procedure and reduce the bloating. If you had a lower endoscopy (such as a colonoscopy or flexible sigmoidoscopy) you may notice spotting of blood in your stool or on the toilet paper. If you underwent a bowel prep for your procedure, you may not have a normal bowel movement for a few days.  Please Note:  You might notice some irritation and congestion in your nose or some drainage.  This is from the oxygen used during your procedure.  There is no need for concern and it should clear up in a day or so.  SYMPTOMS TO REPORT IMMEDIATELY:  Following lower endoscopy (colonoscopy or flexible sigmoidoscopy):  Excessive amounts of blood in the stool  Significant tenderness or worsening of abdominal pains  Swelling of the abdomen that is new, acute  Fever of 100F or higher  Following upper endoscopy (EGD)  Vomiting of blood or coffee ground material  New chest pain or pain under the shoulder blades  Painful or persistently difficult swallowing  New shortness of breath  Fever of 100F or higher  Black, tarry-looking stools  For urgent or emergent issues, a gastroenterologist can be reached at any hour by calling [phone]. Do not use  MyChart messaging for urgent concerns.    DIET:  We do recommend a small meal at first, but then you may proceed to your regular diet.  Drink plenty of fluids but you should avoid alcoholic beverages for 24 hours.  ACTIVITY:  You should plan to take it easy for the rest of today and you should NOT DRIVE or use heavy machinery until tomorrow (because of the sedation medicines used during the test).    FOLLOW UP: Our staff will call the number listed on your records the next business day following your procedure.  We will call around 7:15- 8:00 am to check on you and address any questions or concerns that you may have regarding the information given to you following your procedure. If we do not reach you, we will leave a message.     If any biopsies were taken you will be contacted by phone or by letter within the next 1-3 weeks.  Please call us at [phone] if you have not heard about the biopsies in 3 weeks.    SIGNATURES/CONFIDENTIALITY: You and/or your care partner have signed paperwork which will be entered into your electronic medical record.  These signatures attest to the fact that that the information above on your After Visit Summary has been reviewed and is understood.  Full responsibility of the confidentiality of this discharge information lies with you and/or your care-partner.

## 2023-12-02 NOTE — Op Note (Signed)
 [facility] Patient Name: Leon Carroll Procedure Date: 12/02/2023 3:37 PM MRN: [id number] Endoscopist: Corliss Parish , MD, [id number] Age: 48 Referring MD:  Date of Birth: 25-Sep-1975 Gender: Male Account #: [id number] Procedure:                Colonoscopy Indications:              Screening for colorectal malignant neoplasm,                            Incidental change in bowel habits noted Medicines:                Monitored Anesthesia Care Procedure:                Pre-Anesthesia Assessment:                           - Prior to the procedure, a History and Physical                            was performed, and patient medications and                            allergies were reviewed. The patient's tolerance of                            previous anesthesia was also reviewed. The risks                            and benefits of the procedure and the sedation                            options and risks were discussed with the patient.                            All questions were answered, and informed consent                            was obtained. Prior Anticoagulants: The patient has                            taken no anticoagulant or antiplatelet agents. ASA                            Grade Assessment: II - A patient with mild systemic                            disease. After reviewing the risks and benefits,                            the patient was deemed in satisfactory condition to                            undergo the procedure.  After obtaining informed consent, the colonoscope                            was passed under direct vision. Throughout the                            procedure, the patient's blood pressure, pulse, and                            oxygen saturations were monitored continuously. The                            CF [id number] #[id number] was introduced through the anus                            and advanced to the  the cecum, identified by                            appendiceal orifice and ileocecal valve. The                            colonoscopy was performed without difficulty. The                            patient tolerated the procedure. The quality of the                            bowel preparation was adequate. The ileocecal                            valve, appendiceal orifice, and rectum were                            photographed. Scope In: 3:53:57 PM Scope Out: 4:08:28 PM Scope Withdrawal Time: 0 hours 11 minutes 55 seconds  Total Procedure Duration: 0 hours 14 minutes 31 seconds  Findings:                 The digital rectal exam findings include                            hemorrhoids. Pertinent negatives include no                            palpable rectal lesions.                           A 2 mm polyp was found in the rectum. The polyp was                            sessile. The polyp was removed with a cold snare.                            Resection and retrieval were complete.  Normal mucosa was found in the entire colon.                            Biopsies were taken with a cold forceps for                            histology.                           Non-bleeding non-thrombosed internal hemorrhoids                            were found during retroflexion, during perianal                            exam and during digital exam. The hemorrhoids were                            Grade II (internal hemorrhoids that prolapse but                            reduce spontaneously). Complications:            No immediate complications. Estimated Blood Loss:     Estimated blood loss was minimal. Impression:               - Hemorrhoids found on digital rectal exam.                           - One 2 mm polyp in the rectum, removed with a cold                            snare. Resected and retrieved.                           - Normal mucosa in the entire  examined colon.                            Biopsied.                           - Non-bleeding non-thrombosed internal hemorrhoids. Recommendation:           - The patient will be observed post-procedure,                            until all discharge criteria are met.                           - Discharge patient to home.                           - Patient has a contact number available for                            emergencies. The signs and symptoms of potential  delayed complications were discussed with the                            patient. Return to normal activities tomorrow.                            Written discharge instructions were provided to the                            patient.                           - High fiber diet.                           - Use FiberCon 1-2 tablets PO daily.                           - Continue present medications.                           - Await pathology results.                           - Repeat colonoscopy in 5-10 years for surveillance                            based on pathology results.                           - The findings and recommendations were discussed                            with the patient. Corliss Parish, MD 12/02/2023 4:16:19 PM

## 2023-12-02 NOTE — Progress Notes (Signed)
 Report given to PACU, vss

## 2023-12-02 NOTE — Progress Notes (Signed)
 Pt's states no medical or surgical changes since previsit or office visit.

## 2023-12-02 NOTE — Progress Notes (Signed)
 Called to room to assist during endoscopic procedure.  Patient ID and intended procedure confirmed with present staff. Received instructions for my participation in the procedure from the performing physician.

## 2023-12-05 ENCOUNTER — Telehealth: Payer: Self-pay | Admitting: *Deleted

## 2023-12-05 NOTE — Telephone Encounter (Signed)
 Attempted post procedure follow up call.  No answer - LVM.

## 2023-12-07 LAB — SURGICAL PATHOLOGY

## 2023-12-12 ENCOUNTER — Ambulatory Visit: Payer: 59 | Admitting: Internal Medicine

## 2023-12-12 ENCOUNTER — Encounter: Payer: Self-pay | Admitting: Internal Medicine

## 2023-12-12 VITALS — BP 132/86 | HR 74 | Temp 97.5°F | Resp 16 | Ht 73.0 in | Wt 254.2 lb

## 2023-12-12 DIAGNOSIS — E538 Deficiency of other specified B group vitamins: Secondary | ICD-10-CM | POA: Diagnosis not present

## 2023-12-12 DIAGNOSIS — I10 Essential (primary) hypertension: Secondary | ICD-10-CM | POA: Diagnosis not present

## 2023-12-12 DIAGNOSIS — E291 Testicular hypofunction: Secondary | ICD-10-CM

## 2023-12-12 DIAGNOSIS — Z0001 Encounter for general adult medical examination with abnormal findings: Secondary | ICD-10-CM

## 2023-12-12 DIAGNOSIS — F900 Attention-deficit hyperactivity disorder, predominantly inattentive type: Secondary | ICD-10-CM

## 2023-12-12 DIAGNOSIS — Z0000 Encounter for general adult medical examination without abnormal findings: Secondary | ICD-10-CM | POA: Diagnosis not present

## 2023-12-12 LAB — LUTEINIZING HORMONE: LH: 5.98 m[IU]/mL (ref 1.50–9.30)

## 2023-12-12 LAB — PSA: PSA: 0.46 ng/mL (ref 0.10–4.00)

## 2023-12-12 MED ORDER — CYANOCOBALAMIN 1000 MCG/ML IJ SOLN
1000.0000 ug | Freq: Once | INTRAMUSCULAR | Status: AC
Start: 2023-12-12 — End: 2023-12-12
  Administered 2023-12-12: 1000 ug via INTRAMUSCULAR

## 2023-12-12 NOTE — Progress Notes (Unsigned)
 Subjective:  Patient ID: Leon Carroll, male    DOB: 1975/09/01  Age: 48 y.o. MRN: [id number]  CC: Annual Exam and Hypertension   HPI Leon Carroll presents for a CPX and f/up ---  Discussed the use of AI scribe software for clinical note transcription with the patient, who gave verbal consent to proceed.  History of Present Illness   Leon Carroll "Misty Stanley" is a 48 year old male who presents for a follow-up regarding medication management and knee pain.  He has been experiencing knee pain since playing tennis last week, suspecting a sprain. Despite the pain, he continued to play another match the following day. Initially, the pain improved, but it worsened this morning. The pain is localized to the inside of the knee, is not associated with swelling, and is described as sore. He is attempting the RICE method for management but lacks a compression sleeve or brace.  He is currently taking Strattera for ADHD and finds it beneficial, noting it is different from Vyvanse as it does not make him feel 'ramped up'. He is considering a possible dose increase. He is not taking meloxicam or methylprednisolone currently.  He mentions a history of low testosterone, previously managed with a nasal medication that he discontinued due to nasal soreness. He reports symptoms of fatigue, weight gain despite being active, and issues with libido and erectile function. He wants to resume treatment for low testosterone, preferring non-injection methods due to difficulty with self-administration.  No chest pain, shortness of breath, dizziness, lightheadedness, blood pressure symptoms, headache, or blurred vision.       Outpatient Medications Prior to Visit  Medication Sig Dispense Refill   atomoxetine (STRATTERA) 40 MG capsule Take 1 capsule (40 mg total) by mouth daily. 90 capsule 0   Eszopiclone 3 MG TABS Take 1 tablet (3 mg total) by mouth at bedtime. Take immediately before bedtime 90 tablet  0   hyoscyamine (LEVBID) 0.375 MG 12 hr tablet Take 1 tablet (0.375 mg total) by mouth 2 (two) times daily. 180 tablet 0   irbesartan (AVAPRO) 150 MG tablet Take 1 tablet (150 mg total) by mouth daily. 90 tablet 1   meloxicam (MOBIC) 15 MG tablet Take 1 tablet (15 mg total) by mouth daily. 30 tablet 0   tadalafil (CIALIS) 20 MG tablet Take 1 tablet (20 mg total) by mouth daily as needed for erectile dysfunction. 10 tablet 5   valACYclovir (VALTREX) 1000 MG tablet Take 1 tablet (1,000 mg total) by mouth 2 (two) times daily. (Patient taking differently: Take 1,000 mg by mouth as needed.) 90 tablet 0   vortioxetine HBr (TRINTELLIX) 20 MG TABS tablet Take 1 tablet (20 mg total) by mouth daily. 90 tablet 0   methylPREDNISolone (MEDROL DOSEPAK) 4 MG TBPK tablet Take 6 tablets on day 1.  Take 5 tablets on day 2.  Take 4 tablets on day 3.  Take 3 tablets on day 4.  Take 2 tablets on day 5.  Take 1 tablet on day 6. 21 tablet 0   No facility-administered medications prior to visit.    ROS Review of Systems  Objective:  BP 132/86 (BP Location: Left Arm, Patient Position: Sitting, Cuff Size: Normal)   Pulse 74   Temp (!) 97.5 F (36.4 C) (Oral)   Resp 16   Ht 6\' 1"  (1.854 m)   Wt 254 lb 3.2 oz (115.3 kg)   SpO2 97%   BMI 33.54 kg/m   BP Readings from Last  3 Encounters:  12/12/23 132/86  12/02/23 118/76  10/14/23 114/78    Wt Readings from Last 3 Encounters:  12/12/23 254 lb 3.2 oz (115.3 kg)  12/02/23 257 lb (116.6 kg)  10/14/23 257 lb (116.6 kg)    Physical Exam  Lab Results  Component Value Date   WBC 5.0 10/14/2023   HGB 15.7 10/14/2023   HCT 45.3 10/14/2023   PLT 225.0 10/14/2023   GLUCOSE 90 10/14/2023   CHOL 252 (H) 07/26/2023   TRIG 201.0 (H) 07/26/2023   HDL 37.40 (L) 07/26/2023   LDLDIRECT 173.0 08/02/2013   LDLCALC 175 (H) 07/26/2023   ALT 20 10/14/2023   AST 18 10/14/2023   NA 135 10/14/2023   K 4.4 10/14/2023   CL 101 10/14/2023   CREATININE 1.17 10/14/2023    BUN 12 10/14/2023   CO2 30 10/14/2023   TSH 1.70 07/26/2023   PSA 0.46 12/12/2023    No results found.  Assessment & Plan:  Essential hypertension, benign  ADHD (attention deficit hyperactivity disorder), inattentive type  B12 deficiency -     Cyanocobalamin  Encounter for general adult medical examination with abnormal findings -     PSA; Future  Hypogonadism male -     Prolactin; Future -     Testosterone Total,Free,Bio, Males; Future -     Luteinizing hormone; Future     Follow-up: Return in about 6 months (around 06/12/2024).  Sandra Crouch, MD

## 2023-12-12 NOTE — Patient Instructions (Signed)
 Health Maintenance, Male  Adopting a healthy lifestyle and getting preventive care are important in promoting health and wellness. Ask your health care provider about:  The right schedule for you to have regular tests and exams.  Things you can do on your own to prevent diseases and keep yourself healthy.  What should I know about diet, weight, and exercise?  Eat a healthy diet    Eat a diet that includes plenty of vegetables, fruits, low-fat dairy products, and lean protein.  Do not eat a lot of foods that are high in solid fats, added sugars, or sodium.  Maintain a healthy weight  Body mass index (BMI) is a measurement that can be used to identify possible weight problems. It estimates body fat based on height and weight. Your health care provider can help determine your BMI and help you achieve or maintain a healthy weight.  Get regular exercise  Get regular exercise. This is one of the most important things you can do for your health. Most adults should:  Exercise for at least 150 minutes each week. The exercise should increase your heart rate and make you sweat (moderate-intensity exercise).  Do strengthening exercises at least twice a week. This is in addition to the moderate-intensity exercise.  Spend less time sitting. Even light physical activity can be beneficial.  Watch cholesterol and blood lipids  Have your blood tested for lipids and cholesterol at 47 years of age, then have this test every 5 years.  You may need to have your cholesterol levels checked more often if:  Your lipid or cholesterol levels are high.  You are older than 47 years of age.  You are at high risk for heart disease.  What should I know about cancer screening?  Many types of cancers can be detected early and may often be prevented. Depending on your health history and family history, you may need to have cancer screening at various ages. This may include screening for:  Colorectal cancer.  Prostate cancer.  Skin cancer.  Lung  cancer.  What should I know about heart disease, diabetes, and high blood pressure?  Blood pressure and heart disease  High blood pressure causes heart disease and increases the risk of stroke. This is more likely to develop in people who have high blood pressure readings or are overweight.  Talk with your health care provider about your target blood pressure readings.  Have your blood pressure checked:  Every 3-5 years if you are 9-95 years of age.  Every year if you are 85 years old or older.  If you are between the ages of 29 and 29 and are a current or former smoker, ask your health care provider if you should have a one-time screening for abdominal aortic aneurysm (AAA).  Diabetes  Have regular diabetes screenings. This checks your fasting blood sugar level. Have the screening done:  Once every three years after age 23 if you are at a normal weight and have a low risk for diabetes.  More often and at a younger age if you are overweight or have a high risk for diabetes.  What should I know about preventing infection?  Hepatitis B  If you have a higher risk for hepatitis B, you should be screened for this virus. Talk with your health care provider to find out if you are at risk for hepatitis B infection.  Hepatitis C  Blood testing is recommended for:  Everyone born from [birth year] through 1964.  Anyone  with known risk factors for hepatitis C.  Sexually transmitted infections (STIs)  You should be screened each year for STIs, including gonorrhea and chlamydia, if:  You are sexually active and are younger than 47 years of age.  You are older than 47 years of age and your health care provider tells you that you are at risk for this type of infection.  Your sexual activity has changed since you were last screened, and you are at increased risk for chlamydia or gonorrhea. Ask your health care provider if you are at risk.  Ask your health care provider about whether you are at high risk for HIV. Your health care provider  may recommend a prescription medicine to help prevent HIV infection. If you choose to take medicine to prevent HIV, you should first get tested for HIV. You should then be tested every 3 months for as long as you are taking the medicine.  Follow these instructions at home:  Alcohol use  Do not drink alcohol if your health care provider tells you not to drink.  If you drink alcohol:  Limit how much you have to 0-2 drinks a day.  Know how much alcohol is in your drink. In the [place] one drink equals one 12 oz bottle of beer (355 mL), one 5 oz glass of wine (148 mL), or one 1 oz glass of hard liquor (44 mL).  Lifestyle  Do not use any products that contain nicotine or tobacco. These products include cigarettes, chewing tobacco, and vaping devices, such as e-cigarettes. If you need help quitting, ask your health care provider.  Do not use street drugs.  Do not share needles.  Ask your health care provider for help if you need support or information about quitting drugs.  General instructions  Schedule regular health, dental, and eye exams.  Stay current with your vaccines.  Tell your health care provider if:  You often feel depressed.  You have ever been abused or do not feel safe at home.  Summary  Adopting a healthy lifestyle and getting preventive care are important in promoting health and wellness.  Follow your health care provider's instructions about healthy diet, exercising, and getting tested or screened for diseases.  Follow your health care provider's instructions on monitoring your cholesterol and blood pressure.  This information is not intended to replace advice given to you by your health care provider. Make sure you discuss any questions you have with your health care provider.  Document Revised: 01/05/2021 Document Reviewed: 01/05/2021  Elsevier Patient Education  2024 [employer].

## 2023-12-13 ENCOUNTER — Encounter: Payer: Self-pay | Admitting: Internal Medicine

## 2023-12-13 ENCOUNTER — Telehealth: Payer: Self-pay | Admitting: Internal Medicine

## 2023-12-13 LAB — PROLACTIN: Prolactin: 6.2 ng/mL (ref 2.0–18.0)

## 2023-12-13 LAB — TESTOSTERONE TOTAL,FREE,BIO, MALES
Albumin: 4.4 g/dL (ref 3.6–5.1)
Sex Hormone Binding: 35 nmol/L (ref 10–50)
Testosterone, Bioavailable: 105.8 ng/dL — ABNORMAL LOW (ref 110.0–575.0)
Testosterone, Free: 52.6 pg/mL (ref 46.0–224.0)
Testosterone: 417 ng/dL (ref 250–827)

## 2023-12-13 MED ORDER — JATENZO 198 MG PO CAPS
1.0000 | ORAL_CAPSULE | Freq: Two times a day (BID) | ORAL | 0 refills | Status: DC
Start: 2023-12-13 — End: 2023-12-14

## 2023-12-13 NOTE — Telephone Encounter (Signed)
 Did you send this medication to this pharmacy for cost reasons?

## 2023-12-13 NOTE — Telephone Encounter (Signed)
 Copied from CRM [id number]. Topic: Clinical - Prescription Issue >> Dec 13, 2023 11:19 AM Freya Jesus wrote: Reason for CRM: Patient medication: Testosterone Undecanoate (JATENZO) 198 MG CAPS [[id number]] was sent to the wrong Pharmacy in [place] . Patient has never been to [place] . Medication should have went to  [facility] [id number] - [place], [place] - [address] [phone] [address] [place] [address]

## 2023-12-14 ENCOUNTER — Other Ambulatory Visit: Payer: Self-pay

## 2023-12-14 ENCOUNTER — Telehealth: Payer: Self-pay | Admitting: Internal Medicine

## 2023-12-14 DIAGNOSIS — E291 Testicular hypofunction: Secondary | ICD-10-CM

## 2023-12-14 MED ORDER — JATENZO 198 MG PO CAPS: 1.0000 | ORAL_CAPSULE | Freq: Two times a day (BID) | ORAL | 2 refills | Status: DC

## 2023-12-14 MED ORDER — JATENZO 198 MG PO CAPS
1.0000 | ORAL_CAPSULE | Freq: Two times a day (BID) | ORAL | 0 refills | Status: DC
Start: 2023-12-14 — End: 2023-12-14

## 2023-12-14 NOTE — Telephone Encounter (Signed)
 Pt inquiring about a cheaper med that his insurance will cover. Pt requests med be sent to [facility] in [place], [place].   Copied from CRM [id number]. Topic: Clinical - Prescription Issue >> Dec 13, 2023 11:19 AM Freya Jesus wrote: Reason for CRM: Patient medication: Testosterone Undecanoate (JATENZO) 198 MG CAPS [[id number]] was sent to the wrong Pharmacy in [place] . Patient has never been to [place] . Medication should have went to  [facility] [id number] - [place] - [address] [phone] [address] [place] [address] >> Dec 14, 2023  9:44 AM Chuck Crater wrote: Patient wants a generic medication that is cheaper than Testosterone Undecanoate (JATENZO) 198 MG CAPS and that his insurance will cover. He wants that sent to [facility] in [facility].

## 2023-12-14 NOTE — Telephone Encounter (Signed)
 Please see note below, medication not pended as pt requesting cheaper alternative. Pharmacy updated. Routing to clinic for review.  Copied from CRM [id number]. Topic: Clinical - Prescription Issue >> Dec 13, 2023 11:19 AM Freya Jesus wrote: Reason for CRM: Patient medication: Testosterone Undecanoate (JATENZO) 198 MG CAPS [[id number]] was sent to the wrong Pharmacy in [place] . Patient has never been to [place] . Medication should have went to  [facility] [id number] - [place], [place] - [address] [phone] [address] [place] [address] >> Dec 14, 2023  9:44 AM Chuck Crater wrote: Patient wants a generic medication that is cheaper than Testosterone Undecanoate (JATENZO) 198 MG CAPS and that his insurance will cover. He wants that sent to [facility] in [facility].

## 2023-12-14 NOTE — Telephone Encounter (Signed)
 Medication refill request has been sent to Dr. Yetta Barre

## 2023-12-15 ENCOUNTER — Telehealth: Payer: Self-pay

## 2023-12-15 ENCOUNTER — Telehealth: Payer: Self-pay | Admitting: Pharmacy Technician

## 2023-12-15 ENCOUNTER — Other Ambulatory Visit (HOSPITAL_COMMUNITY): Payer: Self-pay

## 2023-12-15 ENCOUNTER — Encounter: Payer: Self-pay | Admitting: Gastroenterology

## 2023-12-15 NOTE — Telephone Encounter (Signed)
 Pharmacy Patient Advocate Encounter   Received notification from CoverMyMeds that prior authorization for Jatenzo 198MG  capsules is required/requested.   Insurance verification completed.   The patient is insured through [employer] .   Per test claim:  TESTOSTERONE 1.62% GEL PUMP OR TESTIM GEL is preferred by the insurance.  If suggested medication is appropriate, Please send in a new RX and discontinue this one. If not, please advise as to why it's not appropriate so that we may request a Prior Authorization. Please note, some preferred medications may still require a PA.  If the suggested medications have not been trialed and there are no contraindications to their use, the PA will not be submitted, as it will not be approved.  CMM Key: [facility]

## 2023-12-15 NOTE — Telephone Encounter (Signed)
 Leon Carroll, follow-up in clinic with APP or myself in the next 4 months, if patient desires.

## 2023-12-15 NOTE — Telephone Encounter (Signed)
 Recall f/u appt with Dr  Brice Campi in 4 months has been entered

## 2023-12-15 NOTE — Telephone Encounter (Signed)
**Note De-identified  [place]bright Obfuscation** Please advise 

## 2023-12-19 NOTE — Telephone Encounter (Signed)
 Copied from CRM [id number]. Topic: Clinical - Prescription Issue >> Dec 16, 2023 12:40 PM Luane Rumps D wrote: Reason for CRM: Patient calling back again in regards to his MyChart message for Testosterone  Undecanoate (JATENZO ) 198 MG CAPS, patient inquiring about different medication that would be covered by insurance since this current one is not.

## 2023-12-20 ENCOUNTER — Other Ambulatory Visit: Payer: Self-pay | Admitting: Internal Medicine

## 2023-12-20 DIAGNOSIS — E291 Testicular hypofunction: Secondary | ICD-10-CM

## 2023-12-20 MED ORDER — TESTOSTERONE 20.25 MG/ACT (1.62%) TD GEL: 1.0000 | Freq: Every day | TRANSDERMAL | 1 refills | Status: DC

## 2023-12-22 NOTE — Telephone Encounter (Signed)
 Unable to reach patient. LMTRC

## 2023-12-23 NOTE — Telephone Encounter (Signed)
 Changed to testosterone  1.62% gel.

## 2023-12-30 NOTE — Telephone Encounter (Signed)
 Patient has picked up his new medication.

## 2023-12-31 ENCOUNTER — Other Ambulatory Visit: Payer: Self-pay | Admitting: Internal Medicine

## 2023-12-31 DIAGNOSIS — I10 Essential (primary) hypertension: Secondary | ICD-10-CM

## 2024-01-12 ENCOUNTER — Ambulatory Visit

## 2024-01-16 ENCOUNTER — Other Ambulatory Visit: Payer: Self-pay | Admitting: Internal Medicine

## 2024-01-16 DIAGNOSIS — F3341 Major depressive disorder, recurrent, in partial remission: Secondary | ICD-10-CM

## 2024-01-20 ENCOUNTER — Telehealth: Payer: Self-pay | Admitting: Internal Medicine

## 2024-01-20 ENCOUNTER — Ambulatory Visit (INDEPENDENT_AMBULATORY_CARE_PROVIDER_SITE_OTHER)

## 2024-01-20 DIAGNOSIS — E538 Deficiency of other specified B group vitamins: Secondary | ICD-10-CM

## 2024-01-20 MED ORDER — CYANOCOBALAMIN 1000 MCG/ML IJ SOLN
1000.0000 ug | Freq: Once | INTRAMUSCULAR | Status: AC
  Administered 2024-01-20: 1000 ug via INTRAMUSCULAR

## 2024-01-20 NOTE — Telephone Encounter (Signed)
 Noted. Patient medication was sent to his correct pharmacy.

## 2024-01-20 NOTE — Progress Notes (Signed)
 Patient presented in office today for his Monthly B12. B12 was administered into his left deltoid muscle. He tolerated the injection well and the injection site looked fine. Patient has been scheduled for his next monthly injection.

## 2024-01-20 NOTE — Telephone Encounter (Signed)
 Copied from CRM [id number]. Topic: Clinical - Prescription Issue >> Jan 19, 2024  4:27 PM Aisha D wrote: Reason for CRM: [place] with [facility] stated that they were sent a request for a medication refill for the Testosterone  20.25 MG/ACT (1.62%) GEL for the pt. [place] stated that they don't carry that medication and will have to discard the request.

## 2024-01-20 NOTE — Telephone Encounter (Signed)
 Patient already has his medication from his Preferred pharmacy.

## 2024-01-20 NOTE — Telephone Encounter (Signed)
 Copied from CRM [id number]. Topic: Clinical - Prescription Issue >> Jan 20, 2024 10:45 AM Yolande Hench C wrote: Reason for CRM: Patient returned called from [place] , Jazunique M, CMA; informed patient of the message from [facility]; patient has still requested to speak with Jazunique; please follow up with patient [phone]

## 2024-02-02 ENCOUNTER — Encounter: Payer: Self-pay | Admitting: Internal Medicine

## 2024-02-02 ENCOUNTER — Other Ambulatory Visit: Payer: Self-pay | Admitting: Internal Medicine

## 2024-02-02 DIAGNOSIS — F5102 Adjustment insomnia: Secondary | ICD-10-CM

## 2024-02-08 ENCOUNTER — Other Ambulatory Visit: Payer: Self-pay

## 2024-02-08 DIAGNOSIS — I10 Essential (primary) hypertension: Secondary | ICD-10-CM

## 2024-02-08 MED ORDER — IRBESARTAN 150 MG PO TABS: 150.0000 mg | ORAL_TABLET | Freq: Every day | ORAL | 1 refills | Status: DC

## 2024-03-14 ENCOUNTER — Other Ambulatory Visit: Payer: Self-pay | Admitting: Internal Medicine

## 2024-03-14 ENCOUNTER — Encounter: Payer: Self-pay | Admitting: Internal Medicine

## 2024-03-14 DIAGNOSIS — E291 Testicular hypofunction: Secondary | ICD-10-CM

## 2024-03-15 ENCOUNTER — Encounter: Payer: Self-pay | Admitting: Emergency Medicine

## 2024-03-15 ENCOUNTER — Ambulatory Visit: Admitting: Emergency Medicine

## 2024-03-15 ENCOUNTER — Telehealth: Payer: Self-pay

## 2024-03-15 ENCOUNTER — Ambulatory Visit: Payer: Self-pay

## 2024-03-15 VITALS — BP 132/76 | HR 70 | Temp 98.4°F | Ht 73.0 in | Wt 262.1 lb

## 2024-03-15 DIAGNOSIS — E538 Deficiency of other specified B group vitamins: Secondary | ICD-10-CM

## 2024-03-15 DIAGNOSIS — H6690 Otitis media, unspecified, unspecified ear: Secondary | ICD-10-CM | POA: Insufficient documentation

## 2024-03-15 MED ORDER — CYANOCOBALAMIN 1000 MCG/ML IJ SOLN
1000.0000 ug | Freq: Once | INTRAMUSCULAR | Status: AC
  Administered 2024-03-15: 1000 ug via INTRAMUSCULAR

## 2024-03-15 MED ORDER — HYDROCORTISONE-ACETIC ACID 1-2 % OT SOLN: 3.0000 [drp] | Freq: Three times a day (TID) | OTIC | 1 refills | Status: DC

## 2024-03-15 MED ORDER — AMOXICILLIN-POT CLAVULANATE 875-125 MG PO TABS
1.0000 | ORAL_TABLET | Freq: Two times a day (BID) | ORAL | 0 refills | Status: AC
Start: 2024-03-15 — End: 2024-03-22

## 2024-03-15 NOTE — Addendum Note (Signed)
 Addended by: LEAR, Kadeisha Betsch P on: 03/15/2024 04:16 PM   Modules accepted: Orders

## 2024-03-15 NOTE — Telephone Encounter (Signed)
 Copied from CRM [id number]. Topic: Appointments - Transfer of Care >> Mar 15, 2024  4:02 PM Mercedes MATSU wrote: Pt is requesting to transfer FROM: Dr. Joshua Pt is requesting to transfer TO: Dr. Purcell Reason for requested transfer: Patient states its easier to get an he really liked him the last two times he saw. It is the responsibility of the team the patient would like to transfer to (patient did not say) to reach out to the patient if for any reason this transfer is not acceptable. Patient is also requesting a call back with an update on if he would be able to be Dr. Jennelle new patient.

## 2024-03-15 NOTE — Telephone Encounter (Signed)
 FYI Only or Action Required?: FYI only for provider.  Patient was last seen in primary care on 12/12/2023 by Joshua Debby CROME, MD.  Called Nurse Triage reporting Otalgia.  Symptoms began several days ago.  Interventions attempted: Rest, hydration, or home remedies.  Symptoms are: gradually worsening.  Triage Disposition: See Physician Within 24 Hours  Patient/caregiver understands and will follow disposition?: Yes  Copied from CRM [id number]. Topic: Clinical - Red Word Triage >> Mar 15, 2024  9:19 AM Jasmin G wrote: [facility] that prompted transfer to Nurse Triage: Ear infection, pt. Experiences severe pain doing any  head movements, especially while eating. Reason for Disposition  Bloody discharge or unexplained bleeding from ear canal  Answer Assessment - Initial Assessment Questions 1. LOCATION: Which ear is involved?     Left 2. ONSET: When did the ear pain start?      Few days 3. SEVERITY: How bad is the pain?  (Scale 1-10; mild, moderate or severe)     Moderate 4. URI SYMPTOMS: Do you have a runny nose or cough?     denies 5. FEVER: Do you have a fever? If Yes, ask: What is your temperature, how was it measured, and when did it start?     Denies 6. CAUSE: Have you been swimming recently?, How often do you use Q-TIPS?, Have you had any recent air travel or scuba diving?     Q-tips 7. OTHER SYMPTOMS: Do you have any other symptoms? (e.g., decreased hearing, dizziness, headache, stiff neck, vomiting)     Decreased hearing, hears fluid in his ear. Odor. Bloody discharge on q-tip  Additional info: Requesting B12 injection during this acute visit. He is overdue for month  Protocols used: [place]illa

## 2024-03-15 NOTE — Assessment & Plan Note (Signed)
 Recurrent infection.  Unknown cause. Recommend to start oral antibiotic as well as topical treatment Recommend Augmentin  875 mg twice a day for 7 days and VoSoL  eardrops 3-4 times a day Advised to follow-up with PCP if no better by next week Symptom management discussed May take Tylenol and/or Advil for pain management.

## 2024-03-15 NOTE — Patient Instructions (Signed)
Otitis Externa  Otitis externa is an infection of the outer ear canal. The outer ear canal is the area between the outside of the ear and the eardrum. Otitis externa is sometimes called swimmer's ear. What are the causes? Common causes of this condition include: Swimming in dirty water. Moisture in the ear. An injury to the inside of the ear. An object stuck in the ear. A cut or scrape on the outside of the ear or in the ear canal. What increases the risk? You are more likely to get this condition if you go swimming often. What are the signs or symptoms? Itching in the ear. This is often the first symptom. Swelling of the ear. Redness in the ear. Ear pain. The pain may get worse when you pull on your ear. Pus coming from the ear. How is this treated? This condition may be treated with: Antibiotic ear drops. These are often given for 10-14 days. Medicines to reduce itching and swelling. Follow these instructions at home: If you were prescribed antibiotic ear drops, use them as told by your doctor. Do not stop using them even if you start to feel better. Take over-the-counter and prescription medicines only as told by your doctor. Avoid getting water in your ears as told by your doctor. You may be told to avoid swimming or water sports for a few days. Keep all follow-up visits. How is this prevented? Keep your ears dry. Use the corner of a towel to dry your ears after you swim or bathe. Try not to scratch or put things in your ear. Doing these things makes it easier for germs to grow in your ear. Avoid swimming in lakes, dirty water, or swimming pools that may not have the right amount of a chemical called chlorine. Contact a doctor if: You have a fever. Your ear is still red, swollen, or painful after 3 days. You still have pus coming from your ear after 3 days. Your redness, swelling, or pain gets worse. You have a very bad headache. Get help right away if: You have redness,  swelling, and pain or tenderness behind your ear. Summary Otitis externa is an infection of the outer ear canal. Symptoms include pain, redness, and swelling of the ear. If you were prescribed antibiotic ear drops, use them as told by your doctor. Do not stop using them even if you start to feel better. Try not to scratch or put things in your ear. This information is not intended to replace advice given to you by your health care provider. Make sure you discuss any questions you have with your health care provider. Document Revised: 10/29/2020 Document Reviewed: 10/29/2020 Elsevier Patient Education  2024 Elsevier Inc.  

## 2024-03-15 NOTE — Progress Notes (Signed)
 Leon Carroll 48 y.o.   Chief Complaint  Patient presents with   Ear Pain    Left ear pain being going on more than a week, some odor to it. Had some blood to it, affecting hearing. Pt ear is swollen     HISTORY OF PRESENT ILLNESS: This is a 48 y.o. male complaining of left ear pain possible infection that started about a week ago Has been getting similar infections this time of year for the last couple years No other complaints or medical concerns today.  HPI   Prior to Admission medications   Medication Sig Start Date End Date Taking? Authorizing Provider  acetic acid -hydrocortisone  (VOSOL -HC) OTIC solution Place 3 drops into the left ear 3 (three) times daily. 03/15/24  Yes Dannisha Eckmann, Emil Schanz, MD  amoxicillin -clavulanate (AUGMENTIN ) 875-125 MG tablet Take 1 tablet by mouth 2 (two) times daily for 7 days. 03/15/24 03/22/24 Yes Ulrich Soules, Emil Schanz, MD  atomoxetine  (STRATTERA ) 40 MG capsule Take 1 capsule (40 mg total) by mouth daily. 10/27/23  Yes Joshua Debby CROME, MD  Eszopiclone  3 MG TABS TAKE 1 TABLET BY MOUTH IMMEDIATELY BEFORE BEDTIME 02/03/24  Yes Joshua Debby CROME, MD  irbesartan  (AVAPRO ) 150 MG tablet Take 1 tablet (150 mg total) by mouth daily. 02/08/24  Yes Joshua Debby CROME, MD  tadalafil  (CIALIS ) 20 MG tablet Take 1 tablet (20 mg total) by mouth daily as needed for erectile dysfunction. 11/10/22  Yes Joshua Debby CROME, MD  Testosterone  20.25 MG/ACT (1.62%) GEL Place 1 Act onto the skin daily. 12/20/23  Yes Joshua Debby CROME, MD  valACYclovir  (VALTREX ) 1000 MG tablet Take 1 tablet (1,000 mg total) by mouth 2 (two) times daily. Patient taking differently: Take 1,000 mg by mouth as needed. 11/10/22  Yes Joshua Debby CROME, MD  vortioxetine  HBr (TRINTELLIX ) 20 MG TABS tablet TAKE 1 TABLET BY MOUTH DAILY 01/16/24  Yes Joshua Debby CROME, MD    Allergies  Allergen Reactions   Vyvanse  [Lisdexamfetamine Dimesylate ] Other (See Comments)    Clinching teeth   Azithromycin  Nausea And Vomiting     Patient unsure    Patient Active Problem List   Diagnosis Date Noted   Recurrent major depressive disorder, in partial remission (HCC) 09/12/2023   Encounter for general adult medical examination with abnormal findings 11/11/2022   Premature ejaculation, acquired, generalized, moderate 11/10/2022   Gallstones 08/02/2022   B12 deficiency 01/27/2022   Drug-induced erectile dysfunction 07/12/2019   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/2019   Herpes simplex labialis 10/31/2017   Hypogonadism male 10/25/2016   Vitamin D  deficiency 01/28/2016   Hyperlipidemia with target LDL less than 130 09/19/2015   Screen for colon cancer 08/02/2013   Essential hypertension, benign 08/02/2013   ADHD (attention deficit hyperactivity disorder), 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 No pertinent surgical history.  Social History   Socioeconomic History   Marital status: Legally Separated    Spouse name: Not on file   Number of children: Not on file   Years of education: Not on file   Highest education level: Not on file  Occupational History   Not on file  Tobacco Use   Smoking status: Never   Smokeless tobacco: Never  Vaping Use  Vaping status: Never Used  Substance and Sexual Activity   Alcohol use: Yes    Alcohol/week: 1.0 standard drink of alcohol    Types: 1 Cans of beer per week    Comment: Rarely   Drug use: No   Sexual activity: Yes  Other Topics Concern   Not on file  Social History Narrative   Not on file   Social Drivers of Health   Financial Resource Strain: Not on file  Food Insecurity: Not on file  Transportation Needs: Not on file  Physical Activity: Not on file  Stress:  Not on file  Social Connections: Not on file  Intimate Partner Violence: Not on file    Family History  Problem Relation Age of Onset   Other Mother        Multiple GI issues, unsure of dx.   Bladder Cancer Father    Hypertension Father    Stroke Neg Hx    Cancer Neg Hx    Diabetes Neg Hx    Early death Neg Hx    Heart disease Neg Hx    Hyperlipidemia Neg Hx    Kidney disease Neg Hx    Arthritis Neg Hx      Review of Systems  Constitutional: Negative.  Negative for chills and fever.  HENT:  Positive for ear pain. Negative for congestion and sore throat.   Respiratory: Negative.  Negative for cough and shortness of breath.   Cardiovascular: Negative.  Negative for chest pain and palpitations.  Gastrointestinal:  Negative for abdominal pain, nausea and vomiting.  Skin: Negative.  Negative for rash.  Neurological: Negative.  Negative for dizziness and headaches.  All other systems reviewed and are negative.   Vitals:   03/15/24 1410  BP: 132/76  Pulse: 70  Temp: 98.4 F (36.9 C)  SpO2: 97%    Physical Exam Vitals reviewed.  Constitutional:      Appearance: Normal appearance.  HENT:     Head: Normocephalic.     Right Ear: Tympanic membrane, ear canal and external ear normal.     Ears:     Comments: Left ear: Mild swelling of auricle.  Swelling and tender canal with erythema Normal tympanic membrane.    Mouth/Throat:     Mouth: Mucous membranes are moist.     Pharynx: Oropharynx is clear.  Eyes:     Extraocular Movements: Extraocular movements intact.     Pupils: Pupils are equal, round, and reactive to light.  Cardiovascular:     Rate and Rhythm: Normal rate.  Pulmonary:     Effort: Pulmonary effort is normal.  Skin:    General: Skin is warm and dry.  Neurological:     Mental Status: He is alert and oriented to person, place, and time.  Psychiatric:        Mood and Affect: Mood normal.        Behavior: Behavior normal.      ASSESSMENT & PLAN: A  total of 32 minutes was spent with the patient and counseling/coordination of care regarding preparing for this visit, review of most recent office visit notes, review of all medications and chronic medical conditions under management, diagnosis of ear infection and need for antibiotics, symptom management, pain management, prognosis, documentation and need for follow-up if no better or worse during the next several days.  Problem List Items Addressed This Visit       Nervous and Auditory   Ear infection - Primary   Recurrent infection.  Unknown cause. Recommend to start oral antibiotic as well as topical treatment Recommend Augmentin  875 mg twice a day for 7 days and VoSoL  eardrops 3-4 times a day Advised to follow-up with PCP if no better by next week Symptom management discussed May take Tylenol and/or Advil for pain management.      Relevant Medications   amoxicillin -clavulanate (AUGMENTIN ) 875-125 MG tablet   acetic acid -hydrocortisone  (VOSOL -HC) OTIC solution   Patient Instructions  Otitis Externa  Otitis externa is an infection of the outer ear canal. The outer ear canal is the area between the outside of the ear and the eardrum. Otitis externa is sometimes called swimmer's ear. What are the causes? Common causes of this condition include: Swimming in dirty water. Moisture in the ear. An injury to the inside of the ear. An object stuck in the ear. A cut or scrape on the outside of the ear or in the ear canal. What increases the risk? You are more likely to get this condition if you go swimming often. What are the signs or symptoms? Itching in the ear. This is often the first symptom. Swelling of the ear. Redness in the ear. Ear pain. The pain may get worse when you pull on your ear. Pus coming from the ear. How is this treated? This condition may be treated with: Antibiotic ear drops. These are often given for 10-14 days. Medicines to reduce itching and  swelling. Follow these instructions at home: If you were prescribed antibiotic ear drops, use them as told by your doctor. Do not stop using them even if you start to feel better. Take over-the-counter and prescription medicines only as told by your doctor. Avoid getting water in your ears as told by your doctor. You may be told to avoid swimming or water sports for a few days. Keep all follow-up visits. How is this prevented? Keep your ears dry. Use the corner of a towel to dry your ears after you swim or bathe. Try not to scratch or put things in your ear. Doing these things makes it easier for germs to grow in your ear. Avoid swimming in lakes, dirty water, or swimming pools that may not have the right amount of a chemical called chlorine. Contact a doctor if: You have a fever. Your ear is still red, swollen, or painful after 3 days. You still have pus coming from your ear after 3 days. Your redness, swelling, or pain gets worse. You have a very bad headache. Get help right away if: You have redness, swelling, and pain or tenderness behind your ear. Summary Otitis externa is an infection of the outer ear canal. Symptoms include pain, redness, and swelling of the ear. If you were prescribed antibiotic ear drops, use them as told by your doctor. Do not stop using them even if you start to feel better. Try not to scratch or put things in your ear. This information is not intended to replace advice given to you by your health care provider. Make sure you discuss any questions you have with your health care provider. Document Revised: 10/29/2020 Document Reviewed: 10/29/2020 Elsevier Patient Education  2024 Elsevier Inc.     Emil Schaumann, MD [facility]

## 2024-03-16 ENCOUNTER — Other Ambulatory Visit: Payer: Self-pay

## 2024-03-16 DIAGNOSIS — E291 Testicular hypofunction: Secondary | ICD-10-CM

## 2024-03-18 MED ORDER — TESTOSTERONE 20.25 MG/ACT (1.62%) TD GEL: 1.0000 | Freq: Every day | TRANSDERMAL | 0 refills | Status: AC

## 2024-03-23 ENCOUNTER — Encounter: Payer: Self-pay | Admitting: Emergency Medicine

## 2024-03-23 NOTE — Telephone Encounter (Signed)
 Patient has been made aware.

## 2024-03-23 NOTE — Telephone Encounter (Signed)
 Not accepting new patients at this time.  Transfer not okay with me.

## 2024-03-23 NOTE — Telephone Encounter (Signed)
Yes, I am [place] with this 

## 2024-03-23 NOTE — Telephone Encounter (Signed)
 Is the transfer of care okay with the both of you ?

## 2024-03-26 NOTE — Telephone Encounter (Signed)
 Negative.  I will not be taking him as a new patient.  Thanks.

## 2024-03-30 ENCOUNTER — Encounter: Payer: Self-pay | Admitting: Internal Medicine

## 2024-04-18 ENCOUNTER — Encounter: Payer: Self-pay | Admitting: Emergency Medicine

## 2024-04-18 NOTE — Telephone Encounter (Signed)
 Still symptomatic with leaking bloody pus.  Needs to be reassessed in the office again.  Any provider can see him for this.

## 2024-04-23 ENCOUNTER — Other Ambulatory Visit: Payer: Self-pay | Admitting: Internal Medicine

## 2024-04-23 DIAGNOSIS — F3341 Major depressive disorder, recurrent, in partial remission: Secondary | ICD-10-CM

## 2024-04-25 ENCOUNTER — Encounter: Payer: Self-pay | Admitting: Internal Medicine

## 2024-04-25 ENCOUNTER — Ambulatory Visit: Payer: Self-pay

## 2024-04-25 ENCOUNTER — Ambulatory Visit: Admitting: Internal Medicine

## 2024-04-25 VITALS — BP 128/86 | HR 63 | Temp 97.7°F | Ht 73.0 in | Wt 248.2 lb

## 2024-04-25 DIAGNOSIS — H6690 Otitis media, unspecified, unspecified ear: Secondary | ICD-10-CM

## 2024-04-25 DIAGNOSIS — H7292 Unspecified perforation of tympanic membrane, left ear: Secondary | ICD-10-CM | POA: Diagnosis not present

## 2024-04-25 DIAGNOSIS — H9192 Unspecified hearing loss, left ear: Secondary | ICD-10-CM

## 2024-04-25 DIAGNOSIS — E538 Deficiency of other specified B group vitamins: Secondary | ICD-10-CM | POA: Diagnosis not present

## 2024-04-25 MED ORDER — NEOMYCIN-POLYMYXIN-HC 3.5-10000-1 OT SOLN
3.0000 [drp] | Freq: Three times a day (TID) | OTIC | 3 refills | Status: DC
Start: 2024-04-25 — End: 2024-06-12

## 2024-04-25 MED ORDER — CYANOCOBALAMIN 1000 MCG/ML IJ SOLN
1000.0000 ug | Freq: Once | INTRAMUSCULAR | Status: AC
  Administered 2024-04-25: 1000 ug via INTRAMUSCULAR

## 2024-04-25 MED ORDER — CEFDINIR 300 MG PO CAPS: 300.0000 mg | ORAL_CAPSULE | Freq: Two times a day (BID) | ORAL | 1 refills | Status: DC

## 2024-04-25 MED ORDER — PSEUDOEPHEDRINE HCL ER 120 MG PO TB12: 120.0000 mg | ORAL_TABLET | Freq: Two times a day (BID) | ORAL | 1 refills | Status: DC | PRN

## 2024-04-25 NOTE — Assessment & Plan Note (Signed)
 L ear pain, hearing loss x2 months duration. Ear Drainage (Left ear pain, ongoing since he was seen 07/17 for the same issue (the patient saw Dr. Purcell and was put on Augmentin  and eardrops).  There was mild improvement on his symptoms and then the symptoms came back. Ear is draining pus and blood, notices more drainage and pain when trying to chew food. Denies N/V, and fevers. He has a history of recurrent ear infection on the left over past 2-3 years, usually in summertime. He is a Armed forces operational officer. He seems to have a perforated eardrum on the left. Prescribe Omnicef  400 mg twice a day for 2 weeks with 1 refill.  Use for 4 weeks if not well after 2 weeks. Sudafed 12-hour 1 twice a day Cortisporin otic drops if the drainage is worse ENT consultation

## 2024-04-25 NOTE — Assessment & Plan Note (Addendum)
 New perforated eardrum on the left likely due to otitis media.  Close ear with a cotton ball when washing hair.  ENT consultation   (L ear pain, hearing loss x2 months duration. Ear Drainage (Left ear pain, ongoing since he was seen 07/17 for the same issue (the patient saw Dr. Purcell and was put on Augmentin  and eardrops).  There was mild improvement on his symptoms and then the symptoms came back. Ear is draining pus and blood, notices more drainage and pain when trying to chew food. Denies N/V, and fevers. He has a history of recurrent ear infection on the left over past 2-3 years, usually in summertime. He is a Armed forces operational officer)

## 2024-04-25 NOTE — Assessment & Plan Note (Signed)
 On B12 injections

## 2024-04-25 NOTE — Assessment & Plan Note (Signed)
 Recurrent.  Currently related to perforated eardrum and otitis media Treat otitis.  ENT consultation

## 2024-04-25 NOTE — Progress Notes (Signed)
 Subjective:  Patient ID: Leon Carroll, male    DOB: 1976/07/18  Age: 48 y.o. MRN: [id number]  CC: Ear Drainage (Left ear pain, ongoing since he was seen 07/17 for the same issue. Resolved slightly and came back. Ear is draining pus and blood, notices more drainage when trying to chew food. Denies N/V, and fevers.)   HPI Leon Carroll presents for L ear pain, hearing loss x2 months duration. Ear Drainage (Left ear pain, ongoing since he was seen 07/17 for the same issue (the patient saw Dr. Purcell and was put on Augmentin  and eardrops).  There was mild improvement on his symptoms and then the symptoms came back. Ear is draining pus and blood, notices more drainage and pain when trying to chew food. Denies N/V, and fevers. He has a history of recurrent ear infection on the left over past 2-3 years, usually in summertime.  He denies seasonal allergies, cough, sneezing etc. He is a tennis player  Outpatient Medications Prior to Visit  Medication Sig Dispense Refill   acetic acid -hydrocortisone  (VOSOL -HC) OTIC solution Place 3 drops into the left ear 3 (three) times daily. 10 mL 1   atomoxetine  (STRATTERA ) 40 MG capsule Take 1 capsule (40 mg total) by mouth daily. 90 capsule 0   Eszopiclone  3 MG TABS TAKE 1 TABLET BY MOUTH IMMEDIATELY BEFORE BEDTIME 90 tablet 0   irbesartan  (AVAPRO ) 150 MG tablet Take 1 tablet (150 mg total) by mouth daily. 90 tablet 1   tadalafil  (CIALIS ) 20 MG tablet Take 1 tablet (20 mg total) by mouth daily as needed for erectile dysfunction. 10 tablet 5   Testosterone  20.25 MG/ACT (1.62%) GEL Place 1 Act onto the skin daily. 75 g 0   valACYclovir  (VALTREX ) 1000 MG tablet Take 1 tablet (1,000 mg total) by mouth 2 (two) times daily. (Patient taking differently: Take 1,000 mg by mouth as needed.) 90 tablet 0   vortioxetine  HBr (TRINTELLIX ) 20 MG TABS tablet TAKE 1 TABLET BY MOUTH DAILY 90 tablet 0   No facility-administered medications prior to visit.     ROS: Review of Systems  Constitutional:  Negative for chills and fever.  HENT:  Positive for ear discharge, ear pain and hearing loss. Negative for congestion, dental problem, facial swelling, sinus pressure, sinus pain, sneezing, sore throat and trouble swallowing.   Eyes:  Negative for visual disturbance.  Respiratory:  Negative for cough.   Gastrointestinal:  Negative for abdominal pain.  Musculoskeletal:  Negative for arthralgias.  Neurological:  Negative for headaches.  Hematological:  Does not bruise/bleed easily.  Psychiatric/Behavioral:  Negative for decreased concentration.     Objective:  BP 128/86 (BP Location: Left Arm, Patient Position: Sitting)   Pulse 63   Temp 97.7 F (36.5 C) (Temporal)   Ht 6' 1 (1.854 m)   Wt 248 lb 3.2 oz (112.6 kg)   SpO2 95%   BMI 32.75 kg/m   BP Readings from Last 3 Encounters:  04/25/24 128/86  03/15/24 132/76  12/12/23 132/86    Wt Readings from Last 3 Encounters:  04/25/24 248 lb 3.2 oz (112.6 kg)  03/15/24 262 lb 2 oz (118.9 kg)  12/12/23 254 lb 3.2 oz (115.3 kg)    Physical Exam Constitutional:      Appearance: Normal appearance. He is not toxic-appearing or diaphoretic.  HENT:     Right Ear: Tympanic membrane, ear canal and external ear normal. There is no impacted cerumen.     Left Ear: Ear canal and external  ear normal. There is no impacted cerumen.     Nose: Congestion present. No rhinorrhea.     Mouth/Throat:     Pharynx: No posterior oropharyngeal erythema.  Cardiovascular:     Rate and Rhythm: Normal rate.    No wax.  A perforated eardrum on the left. Lab Results  Component Value Date   WBC 5.0 10/14/2023   HGB 15.7 10/14/2023   HCT 45.3 10/14/2023   PLT 225.0 10/14/2023   GLUCOSE 90 10/14/2023   CHOL 252 (H) 07/26/2023   TRIG 201.0 (H) 07/26/2023   HDL 37.40 (L) 07/26/2023   LDLDIRECT 173.0 08/02/2013   LDLCALC 175 (H) 07/26/2023   ALT 20 10/14/2023   AST 18 10/14/2023   NA 135 10/14/2023   K  4.4 10/14/2023   CL 101 10/14/2023   CREATININE 1.17 10/14/2023   BUN 12 10/14/2023   CO2 30 10/14/2023   TSH 1.70 07/26/2023   PSA 0.46 12/12/2023    No results found.  Assessment & Plan:   Problem List Items Addressed This Visit     B12 deficiency - Primary   On B12 injections      Ear infection   L ear pain, hearing loss x2 months duration. Ear Drainage (Left ear pain, ongoing since he was seen 07/17 for the same issue (the patient saw Dr. Purcell and was put on Augmentin  and eardrops).  There was mild improvement on his symptoms and then the symptoms came back. Ear is draining pus and blood, notices more drainage and pain when trying to chew food. Denies N/V, and fevers. He has a history of recurrent ear infection on the left over past 2-3 years, usually in summertime. He is a Armed forces operational officer. He seems to have a perforated eardrum on the left. Prescribe Omnicef  400 mg twice a day for 2 weeks with 1 refill.  Use for 4 weeks if not well after 2 weeks. Sudafed 12-hour 1 twice a day Cortisporin otic drops if the drainage is worse ENT consultation      Relevant Medications   cefdinir  (OMNICEF ) 300 MG capsule   Other Relevant Orders   Ambulatory referral to ENT   Hearing loss of left ear   Recurrent.  Currently related to perforated eardrum and otitis media Treat otitis.  ENT consultation      Perforated eardrum, left   New perforated eardrum on the left likely due to otitis media.  Close ear with a cotton ball when washing hair.  ENT consultation   (L ear pain, hearing loss x2 months duration. Ear Drainage (Left ear pain, ongoing since he was seen 07/17 for the same issue (the patient saw Dr. Purcell and was put on Augmentin  and eardrops).  There was mild improvement on his symptoms and then the symptoms came back. Ear is draining pus and blood, notices more drainage and pain when trying to chew food. Denies N/V, and fevers. He has a history of recurrent ear infection on  the left over past 2-3 years, usually in summertime. He is a Armed forces operational officer)         Meds ordered this encounter  Medications   cyanocobalamin  (VITAMIN B12) injection 1,000 mcg   cefdinir  (OMNICEF ) 300 MG capsule    Sig: Take 1 capsule (300 mg total) by mouth 2 (two) times daily.    Dispense:  28 capsule    Refill:  1   pseudoephedrine  (SUDAFED) 120 MG 12 hr tablet    Sig: Take 1 tablet (120 mg  total) by mouth 2 (two) times daily as needed for congestion.    Dispense:  60 tablet    Refill:  1   neomycin -polymyxin-hydrocortisone  (CORTISPORIN) OTIC solution    Sig: Place 3 drops into both ears 3 (three) times daily.    Dispense:  10 mL    Refill:  3      Follow-up: Return for f/u with PCP.  Marolyn Noel, MD

## 2024-04-25 NOTE — Telephone Encounter (Signed)
 FYI Only or Action Required?: Action required by provider: request for appointment.  Patient was last seen in primary care on 03/15/2024 by Purcell Emil Schanz, MD.  Called Nurse Triage reporting Ear Drainage.  Symptoms began several weeks ago.  Interventions attempted: Prescription medications:  SABRA  Symptoms are: gradually worsening. Treated several weeks ago for ear infection, never really cleared up. Has bloody, pus drainage.  Triage Disposition: See Physician Within 24 Hours  Patient/caregiver understands and will follow disposition?: Yes   Copied from CRM #[id number]. Topic: Clinical - Red Word Triage >> Apr 25, 2024  9:17 AM Rosina BIRCH wrote: Red Word that prompted transfer to Nurse Triage: leaking bloody pus in left ear when he move his jar or eat. Patient is also having a little bit of pain with it Reason for Disposition  Ear pain  Answer Assessment - Initial Assessment Questions 1. LOCATION: Which ear is involved?      right 2. COLOR: What is the color of the discharge?      Bloody, pus 3. CONSISTENCY: How runny is the discharge? Could it be water?      No water 4. ONSET: When did you first notice the discharge?     Several weeks 5. PAIN: Is there any earache? How bad is it?  (Scale 0-10; none, mild, moderate or severe)     mild 6. OBJECTS: Have you put anything in your ear? (e.g., Q-tip, other object)      no 7. OTHER SYMPTOMS: Do you have any other symptoms? (e.g., headache, fever, dizziness, vomiting, runny nose)     no 8. PREGNANCY: Is there any chance you are pregnant? When was your last menstrual period?     N/a  Protocols used: Ear - Discharge-A-AH

## 2024-05-03 ENCOUNTER — Other Ambulatory Visit: Payer: Self-pay | Admitting: Internal Medicine

## 2024-05-03 DIAGNOSIS — F5102 Adjustment insomnia: Secondary | ICD-10-CM

## 2024-05-03 NOTE — Telephone Encounter (Unsigned)
 Copied from CRM [id number]. Topic: Clinical - Medication Refill >> May 03, 2024  9:56 AM Macario HERO wrote: Medication: Eszopiclone  3 MG TABS [[id number]]  Has the patient contacted their pharmacy? No (Agent: If no, request that the patient contact the pharmacy for the refill. If patient does not wish to contact the pharmacy document the reason why and proceed with request.) (Agent: If yes, when and what did the pharmacy advise?)  This is the patient's preferred pharmacy:  [id number] - [place], [place] - [address] [place] [address] Phone: [phone] Fax: [phone]  Is this the correct pharmacy for this prescription? Yes If no, delete pharmacy and type the correct one.   Has the prescription been filled recently? Yes  Is the patient out of the medication? Yes, will be out after tonight. Patient said he won't be able to sleep without it.  Has the patient been seen for an appointment in the last year OR does the patient have an upcoming appointment? Yes  Can we respond through MyChart? Yes  Agent: Please be advised that Rx refills may take up to 3 business days. We ask that you follow-up with your pharmacy.

## 2024-05-04 MED ORDER — ESZOPICLONE 3 MG PO TABS: 3.0000 mg | ORAL_TABLET | Freq: Every day | ORAL | 0 refills | Status: DC

## 2024-05-04 NOTE — Telephone Encounter (Unsigned)
 Copied from CRM [id number]. Topic: Clinical - Prescription Issue >> May 04, 2024  9:34 AM Suzen RAMAN wrote: Reason for CRM: Patient would like too know if his medication refill can be approved aside from his provider being out of the office til the 9th of September. Medication needed is Eszopiclone  3 MG TABS. Without medication patient is unable to sleep.

## 2024-05-14 ENCOUNTER — Telehealth (INDEPENDENT_AMBULATORY_CARE_PROVIDER_SITE_OTHER): Payer: Self-pay | Admitting: Physician Assistant

## 2024-05-14 ENCOUNTER — Institutional Professional Consult (permissible substitution) (INDEPENDENT_AMBULATORY_CARE_PROVIDER_SITE_OTHER): Admitting: Physician Assistant

## 2024-05-14 NOTE — Telephone Encounter (Signed)
 Left vm to reschedule, provider out of office

## 2024-05-16 ENCOUNTER — Ambulatory Visit: Admitting: Dermatology

## 2024-06-06 ENCOUNTER — Encounter: Payer: Self-pay | Admitting: Gastroenterology

## 2024-06-12 ENCOUNTER — Ambulatory Visit: Payer: Self-pay | Admitting: Internal Medicine

## 2024-06-12 ENCOUNTER — Encounter: Payer: Self-pay | Admitting: Internal Medicine

## 2024-06-12 ENCOUNTER — Ambulatory Visit: Admitting: Internal Medicine

## 2024-06-12 VITALS — BP 124/84 | HR 75 | Temp 98.1°F | Ht 73.0 in | Wt 244.2 lb

## 2024-06-12 DIAGNOSIS — E876 Hypokalemia: Secondary | ICD-10-CM

## 2024-06-12 DIAGNOSIS — I10 Essential (primary) hypertension: Secondary | ICD-10-CM

## 2024-06-12 DIAGNOSIS — E538 Deficiency of other specified B group vitamins: Secondary | ICD-10-CM

## 2024-06-12 DIAGNOSIS — N522 Drug-induced erectile dysfunction: Secondary | ICD-10-CM

## 2024-06-12 DIAGNOSIS — F3341 Major depressive disorder, recurrent, in partial remission: Secondary | ICD-10-CM | POA: Diagnosis not present

## 2024-06-12 DIAGNOSIS — E66811 Obesity, class 1: Secondary | ICD-10-CM | POA: Insufficient documentation

## 2024-06-12 DIAGNOSIS — E785 Hyperlipidemia, unspecified: Secondary | ICD-10-CM

## 2024-06-12 LAB — CBC WITH DIFFERENTIAL/PLATELET
Basophils Absolute: 0 K/uL (ref 0.0–0.1)
Basophils Relative: 0.4 % (ref 0.0–3.0)
Eosinophils Absolute: 0.2 K/uL (ref 0.0–0.7)
Eosinophils Relative: 4 % (ref 0.0–5.0)
HCT: 48.1 % (ref 39.0–52.0)
Hemoglobin: 16 g/dL (ref 13.0–17.0)
Lymphocytes Relative: 43.3 % (ref 12.0–46.0)
Lymphs Abs: 2 K/uL (ref 0.7–4.0)
MCHC: 33.2 g/dL (ref 30.0–36.0)
MCV: 92.8 fl (ref 78.0–100.0)
Monocytes Absolute: 0.3 K/uL (ref 0.1–1.0)
Monocytes Relative: 7.4 % (ref 3.0–12.0)
Neutro Abs: 2 K/uL (ref 1.4–7.7)
Neutrophils Relative %: 44.9 % (ref 43.0–77.0)
Platelets: 221 K/uL (ref 150.0–400.0)
RBC: 5.19 Mil/uL (ref 4.22–5.81)
RDW: 13.3 % (ref 11.5–15.5)
WBC: 4.5 K/uL (ref 4.0–10.5)

## 2024-06-12 LAB — BASIC METABOLIC PANEL WITH GFR
BUN: 13 mg/dL (ref 6–23)
CO2: 24 meq/L (ref 19–32)
Calcium: 8.8 mg/dL (ref 8.4–10.5)
Chloride: 103 meq/L (ref 96–112)
Creatinine, Ser: 1.09 mg/dL (ref 0.40–1.50)
GFR: 80.69 mL/min (ref >60.00)
Glucose, Bld: 91 mg/dL (ref 70–99)
Potassium: 3.3 meq/L — ABNORMAL LOW (ref 3.5–5.1)
Sodium: 136 meq/L (ref 135–145)

## 2024-06-12 LAB — LIPID PANEL
Cholesterol: 204 mg/dL — ABNORMAL HIGH (ref 0–200)
HDL: 38.7 mg/dL — ABNORMAL LOW (ref >39.00)
LDL Cholesterol: 136 mg/dL — ABNORMAL HIGH (ref 0–99)
NonHDL: 165.32
Total CHOL/HDL Ratio: 5
Triglycerides: 148 mg/dL (ref 0.0–149.0)
VLDL: 29.6 mg/dL (ref 0.0–40.0)

## 2024-06-12 LAB — FOLATE: Folate: 4.9 ng/mL — ABNORMAL LOW (ref >5.9)

## 2024-06-12 MED ORDER — POTASSIUM CHLORIDE ER 10 MEQ PO TBCR: 10.0000 meq | EXTENDED_RELEASE_TABLET | Freq: Two times a day (BID) | ORAL | 0 refills | Status: AC

## 2024-06-12 MED ORDER — VORTIOXETINE HBR 10 MG PO TABS: 10.0000 mg | ORAL_TABLET | Freq: Every day | ORAL | 0 refills | Status: DC

## 2024-06-12 MED ORDER — TADALAFIL 20 MG PO TABS: 20.0000 mg | ORAL_TABLET | Freq: Every day | ORAL | 5 refills | Status: AC | PRN

## 2024-06-12 MED ORDER — FOLIC ACID 1 MG PO TABS: 1.0000 mg | ORAL_TABLET | Freq: Every day | ORAL | 0 refills | Status: AC

## 2024-06-12 MED ORDER — VORTIOXETINE HBR 10 MG PO TABS: 10.0000 mg | ORAL_TABLET | Freq: Every day | ORAL | Status: AC

## 2024-06-12 MED ORDER — CYANOCOBALAMIN 1000 MCG/ML IJ SOLN
1000.0000 ug | Freq: Once | INTRAMUSCULAR | Status: AC
  Administered 2024-06-12: 1000 ug via INTRAMUSCULAR

## 2024-06-12 MED ORDER — ZEPBOUND 2.5 MG/0.5ML ~~LOC~~ SOAJ: 2.5000 mg | SUBCUTANEOUS | 0 refills | Status: DC

## 2024-06-12 MED ORDER — VORTIOXETINE HBR 5 MG PO TABS: 5.0000 mg | ORAL_TABLET | Freq: Every day | ORAL | Status: AC

## 2024-06-12 NOTE — Patient Instructions (Signed)
 Hypertension, Adult High blood pressure (hypertension) is when the force of blood pumping through the arteries is too strong. The arteries are the blood vessels that carry blood from the heart throughout the body. Hypertension forces the heart to work harder to pump blood and may cause arteries to become narrow or stiff. Untreated or uncontrolled hypertension can lead to a heart attack, heart failure, a stroke, kidney disease, and other problems. A blood pressure reading consists of a higher number over a lower number. Ideally, your blood pressure should be below 120/80. The first ("top") number is called the systolic pressure. It is a measure of the pressure in your arteries as your heart beats. The second ("bottom") number is called the diastolic pressure. It is a measure of the pressure in your arteries as the heart relaxes. What are the causes? The exact cause of this condition is not known. There are some conditions that result in high blood pressure. What increases the risk? Certain factors may make you more likely to develop high blood pressure. Some of these risk factors are under your control, including: Smoking. Not getting enough exercise or physical activity. Being overweight. Having too much fat, sugar, calories, or salt (sodium) in your diet. Drinking too much alcohol. Other risk factors include: Having a personal history of heart disease, diabetes, high cholesterol, or kidney disease. Stress. Having a family history of high blood pressure and high cholesterol. Having obstructive sleep apnea. Age. The risk increases with age. What are the signs or symptoms? High blood pressure may not cause symptoms. Very high blood pressure (hypertensive crisis) may cause: Headache. Fast or irregular heartbeats (palpitations). Shortness of breath. Nosebleed. Nausea and vomiting. Vision changes. Severe chest pain, dizziness, and seizures. How is this diagnosed? This condition is diagnosed by  measuring your blood pressure while you are seated, with your arm resting on a flat surface, your legs uncrossed, and your feet flat on the floor. The cuff of the blood pressure monitor will be placed directly against the skin of your upper arm at the level of your heart. Blood pressure should be measured at least twice using the same arm. Certain conditions can cause a difference in blood pressure between your right and left arms. If you have a high blood pressure reading during one visit or you have normal blood pressure with other risk factors, you may be asked to: Return on a different day to have your blood pressure checked again. Monitor your blood pressure at home for 1 week or longer. If you are diagnosed with hypertension, you may have other blood or imaging tests to help your health care provider understand your overall risk for other conditions. How is this treated? This condition is treated by making healthy lifestyle changes, such as eating healthy foods, exercising more, and reducing your alcohol intake. You may be referred for counseling on a healthy diet and physical activity. Your health care provider may prescribe medicine if lifestyle changes are not enough to get your blood pressure under control and if: Your systolic blood pressure is above 130. Your diastolic blood pressure is above 80. Your personal target blood pressure may vary depending on your medical conditions, your age, and other factors. Follow these instructions at home: Eating and drinking  Eat a diet that is high in fiber and potassium, and low in sodium, added sugar, and fat. An example of this eating plan is called the DASH diet. DASH stands for Dietary Approaches to Stop Hypertension. To eat this way: Eat  plenty of fresh fruits and vegetables. Try to fill one half of your plate at each meal with fruits and vegetables. Eat whole grains, such as whole-wheat pasta, brown rice, or whole-grain bread. Fill about one  fourth of your plate with whole grains. Eat or drink low-fat dairy products, such as skim milk or low-fat yogurt. Avoid fatty cuts of meat, processed or cured meats, and poultry with skin. Fill about one fourth of your plate with lean proteins, such as fish, chicken without skin, beans, eggs, or tofu. Avoid pre-made and processed foods. These tend to be higher in sodium, added sugar, and fat. Reduce your daily sodium intake. Many people with hypertension should eat less than 1,500 mg of sodium a day. Do not drink alcohol if: Your health care provider tells you not to drink. You are pregnant, may be pregnant, or are planning to become pregnant. If you drink alcohol: Limit how much you have to: 0-1 drink a day for women. 0-2 drinks a day for men. Know how much alcohol is in your drink. In the [place] one drink equals one 12 oz bottle of beer (355 mL), one 5 oz glass of wine (148 mL), or one 1 oz glass of hard liquor (44 mL). Lifestyle  Work with your health care provider to maintain a healthy body weight or to lose weight. Ask what an ideal weight is for you. Get at least 30 minutes of exercise that causes your heart to beat faster (aerobic exercise) most days of the week. Activities may include walking, swimming, or biking. Include exercise to strengthen your muscles (resistance exercise), such as Pilates or lifting weights, as part of your weekly exercise routine. Try to do these types of exercises for 30 minutes at least 3 days a week. Do not use any products that contain nicotine or tobacco. These products include cigarettes, chewing tobacco, and vaping devices, such as e-cigarettes. If you need help quitting, ask your health care provider. Monitor your blood pressure at home as told by your health care provider. Keep all follow-up visits. This is important. Medicines Take over-the-counter and prescription medicines only as told by your health care provider. Follow directions carefully. Blood  pressure medicines must be taken as prescribed. Do not skip doses of blood pressure medicine. Doing this puts you at risk for problems and can make the medicine less effective. Ask your health care provider about side effects or reactions to medicines that you should watch for. Contact a health care provider if you: Think you are having a reaction to a medicine you are taking. Have headaches that keep coming back (recurring). Feel dizzy. Have swelling in your ankles. Have trouble with your vision. Get help right away if you: Develop a severe headache or confusion. Have unusual weakness or numbness. Feel faint. Have severe pain in your chest or abdomen. Vomit repeatedly. Have trouble breathing. These symptoms may be an emergency. Get help right away. Call 911. Do not wait to see if the symptoms will go away. Do not drive yourself to the hospital. Summary Hypertension is when the force of blood pumping through your arteries is too strong. If this condition is not controlled, it may put you at risk for serious complications. Your personal target blood pressure may vary depending on your medical conditions, your age, and other factors. For most people, a normal blood pressure is less than 120/80. Hypertension is treated with lifestyle changes, medicines, or a combination of both. Lifestyle changes include losing weight, eating a healthy,  low-sodium diet, exercising more, and limiting alcohol. This information is not intended to replace advice given to you by your health care provider. Make sure you discuss any questions you have with your health care provider. Document Revised: 06/23/2021 Document Reviewed: 06/23/2021 Elsevier Patient Education  2024 [employer].

## 2024-06-12 NOTE — Progress Notes (Signed)
 Subjective:  Patient ID: Leon Carroll, male    DOB: Aug 21, 1976  Age: 48 y.o. MRN: [id number]  CC: Medical Management of Chronic Issues (6 month follow up. B12 injection. Medication refills. )   HPI Leon Harlene Redd presents for f/up ---  Discussed the use of AI scribe software for clinical note transcription with the patient, who gave verbal consent to proceed.  History of Present Illness Leon Carroll is a 48 year old male who presents for medication management and weight concerns.  He is seeking refills for his medications and wishes to discuss discontinuing some of them. He is not taking Strattera , which he stopped within a month or two after it was prescribed, as he did not notice any effects. He is currently taking irbesartan  and Lunesta  daily, emphasizing the necessity of Lunesta  for sleep. He also requires a refill for tadalafil . He wants to discontinue Trintellix , as he has not noticed any change in his condition while on it.  He feels generally well, with a stable mood and adequate focus, although some days are more challenging than others. He describes his work as stressful, particularly due to his role at [employer], but he is managing. He is concerned about his weight, noting that despite being active and playing tennis three to four times a week, his weight remains unchanged. He feels discomfort in his midsection and lower back, which he attributes to his weight. He is monitoring his diet but has not seen any changes.  No chest pain, shortness of breath, dizziness, lightheadedness, snoring, or sleep apnea. He reports a brief episode of nausea and feeling 'swimmy headed' over the weekend, which resolved. He has not received a COVID vaccine but does get flu shots. He mentions a past issue with his ear, which was draining for several weeks but has since resolved, although it remains slightly scarred.     Outpatient Medications Prior to Visit  Medication Sig  Dispense Refill   Eszopiclone  3 MG TABS Take 1 tablet (3 mg total) by mouth at bedtime. Take immediately before bedtime 90 tablet 0   irbesartan  (AVAPRO ) 150 MG tablet Take 1 tablet (150 mg total) by mouth daily. 90 tablet 1   Testosterone  20.25 MG/ACT (1.62%) GEL Place 1 Act onto the skin daily. 75 g 0   valACYclovir  (VALTREX ) 1000 MG tablet Take 1 tablet (1,000 mg total) by mouth 2 (two) times daily. (Patient taking differently: Take 1,000 mg by mouth as needed.) 90 tablet 0   acetic acid -hydrocortisone  (VOSOL -HC) OTIC solution Place 3 drops into the left ear 3 (three) times daily. 10 mL 1   atomoxetine  (STRATTERA ) 40 MG capsule Take 1 capsule (40 mg total) by mouth daily. 90 capsule 0   cefdinir  (OMNICEF ) 300 MG capsule Take 1 capsule (300 mg total) by mouth 2 (two) times daily. 28 capsule 1   neomycin -polymyxin-hydrocortisone  (CORTISPORIN) OTIC solution Place 3 drops into both ears 3 (three) times daily. 10 mL 3   pseudoephedrine  (SUDAFED) 120 MG 12 hr tablet Take 1 tablet (120 mg total) by mouth 2 (two) times daily as needed for congestion. 60 tablet 1   tadalafil  (CIALIS ) 20 MG tablet Take 1 tablet (20 mg total) by mouth daily as needed for erectile dysfunction. 10 tablet 5   vortioxetine  HBr (TRINTELLIX ) 20 MG TABS tablet TAKE 1 TABLET BY MOUTH DAILY 90 tablet 0   No facility-administered medications prior to visit.    ROS Review of Systems  Constitutional:  Negative for appetite change,  chills, diaphoresis, fatigue and fever.  HENT: Negative.    Eyes: Negative.   Respiratory:  Negative for cough, chest tightness, shortness of breath and wheezing.   Cardiovascular:  Negative for chest pain, palpitations and leg swelling.  Gastrointestinal: Negative.  Negative for abdominal pain, constipation, diarrhea, nausea and vomiting.  Endocrine: Negative.   Genitourinary: Negative.  Negative for difficulty urinating and dysuria.  Musculoskeletal: Negative.  Negative for arthralgias and back  pain.  Skin: Negative.   Neurological:  Negative for dizziness, weakness and light-headedness.  Hematological:  Negative for adenopathy. Does not bruise/bleed easily.  Psychiatric/Behavioral:  Positive for dysphoric mood and sleep disturbance. Negative for confusion, decreased concentration, self-injury and suicidal ideas. The patient is not nervous/anxious.     Objective:  BP 124/84 (BP Location: Left Arm, Patient Position: Sitting, Cuff Size: Normal)   Pulse 75   Temp 98.1 F (36.7 C) (Oral)   Ht 6' 1 (1.854 m)   Wt 244 lb 3.2 oz (110.8 kg)   SpO2 98%   BMI 32.22 kg/m   BP Readings from Last 3 Encounters:  06/12/24 124/84  04/25/24 128/86  03/15/24 132/76    Wt Readings from Last 3 Encounters:  06/12/24 244 lb 3.2 oz (110.8 kg)  04/25/24 248 lb 3.2 oz (112.6 kg)  03/15/24 262 lb 2 oz (118.9 kg)    Physical Exam Vitals reviewed.  Constitutional:      Appearance: Normal appearance.  HENT:     Right Ear: Hearing, tympanic membrane, ear canal and external ear normal.     Left Ear: Hearing, ear canal and external ear normal.     Ears:     Comments: There is dried blood over the L TM    Mouth/Throat:     Mouth: Mucous membranes are moist.  Eyes:     General: No scleral icterus.    Conjunctiva/sclera: Conjunctivae normal.  Cardiovascular:     Rate and Rhythm: Normal rate and regular rhythm.     Heart sounds: No murmur heard.    No friction rub. No gallop.  Pulmonary:     Effort: Pulmonary effort is normal.     Breath sounds: No stridor. No wheezing, rhonchi or rales.  Abdominal:     General: Abdomen is flat.     Palpations: There is no mass.     Tenderness: There is no abdominal tenderness. There is no guarding.     Hernia: No hernia is present.  Musculoskeletal:        General: Normal range of motion.     Cervical back: Neck supple.     Right lower leg: No edema.     Left lower leg: No edema.  Lymphadenopathy:     Cervical: No cervical adenopathy.  Skin:     General: Skin is warm and dry.  Neurological:     General: No focal deficit present.     Mental Status: He is alert.  Psychiatric:        Mood and Affect: Mood normal.        Behavior: Behavior normal.     Lab Results  Component Value Date   WBC 4.5 06/12/2024   HGB 16.0 06/12/2024   HCT 48.1 06/12/2024   PLT 221.0 06/12/2024   GLUCOSE 91 06/12/2024   CHOL 204 (H) 06/12/2024   TRIG 148.0 06/12/2024   HDL 38.70 (L) 06/12/2024   LDLDIRECT 173.0 08/02/2013   LDLCALC 136 (H) 06/12/2024   ALT 20 10/14/2023   AST 18 10/14/2023  NA 136 06/12/2024   K 3.3 (L) 06/12/2024   CL 103 06/12/2024   CREATININE 1.09 06/12/2024   BUN 13 06/12/2024   CO2 24 06/12/2024   TSH 1.70 07/26/2023   PSA 0.46 12/12/2023    No results found.  Assessment & Plan:  Essential hypertension, benign- BP is well controlled. -     Basic metabolic panel with GFR; Future  B12 deficiency -     CBC with Differential/Platelet; Future -     Folate; Future -     Cyanocobalamin  -     Folic Acid ; Take 1 tablet (1 mg total) by mouth daily.  Dispense: 90 tablet; Refill: 0  Drug-induced erectile dysfunction -     Tadalafil ; Take 1 tablet (20 mg total) by mouth daily as needed for erectile dysfunction.  Dispense: 10 tablet; Refill: 5  Recurrent major depressive disorder, in partial remission- Will slowly taper trintellix . -     Vortioxetine  HBr; Take 1 tablet (10 mg total) by mouth daily. -     Vortioxetine  HBr; Take 1 tablet (5 mg total) by mouth daily.  Hyperlipidemia with target LDL less than 130- Statin is not indicated. -     Lipid panel; Future  Obesity (BMI 30.0-34.9) -     Zepbound; Inject 2.5 mg into the skin once a week.  Dispense: 4 mL; Refill: 0  Folate deficiency -     Folic Acid ; Take 1 tablet (1 mg total) by mouth daily.  Dispense: 90 tablet; Refill: 0  Chronic hypokalemia -     Potassium Chloride ER; Take 1 tablet (10 mEq total) by mouth 2 (two) times daily.  Dispense: 180 tablet;  Refill: 0     Follow-up: Return in about 4 months (around 10/13/2024).  Debby Molt, MD

## 2024-06-19 ENCOUNTER — Telehealth: Payer: Self-pay

## 2024-06-19 ENCOUNTER — Other Ambulatory Visit (HOSPITAL_COMMUNITY): Payer: Self-pay

## 2024-06-19 NOTE — Telephone Encounter (Signed)
 Patient has been made aware by VM very detailed.

## 2024-06-19 NOTE — Telephone Encounter (Signed)
 Pharmacy Patient Advocate Encounter   Received notification from Onbase that prior authorization for Zepbound 2.5 mg/0.5 ml auto injectors is required/requested.   Insurance verification completed.   The patient is insured through [facility].   Per test claim, medication is not covered due to plan/benefit exclusion, PA not submitted at this time      *I do not see where patient has been diagnosed with sleep apnea or a sleep study on his chart

## 2024-06-21 ENCOUNTER — Other Ambulatory Visit: Payer: Self-pay | Admitting: Internal Medicine

## 2024-06-21 ENCOUNTER — Encounter: Payer: Self-pay | Admitting: Internal Medicine

## 2024-06-21 DIAGNOSIS — E66811 Obesity, class 1: Secondary | ICD-10-CM | POA: Insufficient documentation

## 2024-06-21 MED ORDER — SEMAGLUTIDE-WEIGHT MANAGEMENT 1 MG/0.5ML ~~LOC~~ SOAJ: 1.0000 mg | SUBCUTANEOUS | 0 refills | Status: AC

## 2024-06-21 MED ORDER — SEMAGLUTIDE-WEIGHT MANAGEMENT 2.4 MG/0.75ML ~~LOC~~ SOAJ: 2.4000 mg | SUBCUTANEOUS | 0 refills | Status: AC

## 2024-06-21 MED ORDER — SEMAGLUTIDE-WEIGHT MANAGEMENT 1.7 MG/0.75ML ~~LOC~~ SOAJ: 1.7000 mg | SUBCUTANEOUS | 0 refills | Status: AC

## 2024-06-21 MED ORDER — SEMAGLUTIDE-WEIGHT MANAGEMENT 0.25 MG/0.5ML ~~LOC~~ SOAJ: 0.2500 mg | SUBCUTANEOUS | 0 refills | Status: AC

## 2024-06-21 MED ORDER — SEMAGLUTIDE-WEIGHT MANAGEMENT 0.5 MG/0.5ML ~~LOC~~ SOAJ: 0.5000 mg | SUBCUTANEOUS | 0 refills | Status: AC

## 2024-07-31 ENCOUNTER — Encounter: Payer: Self-pay | Admitting: Internal Medicine

## 2024-08-01 ENCOUNTER — Other Ambulatory Visit: Payer: Self-pay | Admitting: Internal Medicine

## 2024-08-01 DIAGNOSIS — F5102 Adjustment insomnia: Secondary | ICD-10-CM

## 2024-08-01 MED ORDER — ESZOPICLONE 3 MG PO TABS: 3.0000 mg | ORAL_TABLET | Freq: Every day | ORAL | 1 refills | Status: AC

## 2024-08-28 ENCOUNTER — Other Ambulatory Visit: Payer: Self-pay | Admitting: Internal Medicine

## 2024-08-28 DIAGNOSIS — I10 Essential (primary) hypertension: Secondary | ICD-10-CM

## 2024-09-14 ENCOUNTER — Encounter: Payer: Self-pay | Admitting: Family Medicine

## 2024-09-14 ENCOUNTER — Ambulatory Visit: Admitting: Family Medicine

## 2024-09-14 VITALS — BP 120/82 | HR 79 | Ht 73.0 in | Wt 250.0 lb

## 2024-09-14 DIAGNOSIS — G8929 Other chronic pain: Secondary | ICD-10-CM | POA: Diagnosis not present

## 2024-09-14 DIAGNOSIS — M545 Low back pain, unspecified: Secondary | ICD-10-CM

## 2024-09-14 MED ORDER — KETOROLAC TROMETHAMINE 60 MG/2ML IM SOLN
60.0000 mg | Freq: Once | INTRAMUSCULAR | Status: AC
  Administered 2024-09-14: 60 mg via INTRAMUSCULAR

## 2024-09-14 MED ORDER — PREDNISONE 50 MG PO TABS: 50.0000 mg | ORAL_TABLET | Freq: Every day | ORAL | 0 refills | Status: AC

## 2024-09-14 MED ORDER — METHOCARBAMOL 500 MG PO TABS: 500.0000 mg | ORAL_TABLET | Freq: Every evening | ORAL | 2 refills | Status: AC | PRN

## 2024-09-14 MED ORDER — METHYLPREDNISOLONE ACETATE 80 MG/ML IJ SUSP
80.0000 mg | Freq: Once | INTRAMUSCULAR | Status: AC
  Administered 2024-09-14: 80 mg via INTRAMUSCULAR

## 2024-09-14 NOTE — Patient Instructions (Addendum)
 Thank you for coming in today.   Intra-muscular Toradol  and Depo-Medrol  injections today.   Prednione 50 mg sent to your pharmacy to start tomorrow. Also sent in RX for Methocarbamol .   Physical therapy at [facility]. A referral for physical therapy has been submitted. A representative from the physical therapy office will contact you to coordinate scheduling after confirming your benefits with your insurance provider. If you do not hear from the physical therapy office within the next 1-2 weeks, please let us  know.  See you back as needed.    TENS UNIT: This is helpful for muscle pain and spasm.   Search and Purchase a TENS 7000 2nd edition at  www.tenspros.com or www.Amazon.com It should be less than $30.     TENS unit instructions: Do not shower or bathe with the unit on Turn the unit off before removing electrodes or batteries If the electrodes lose stickiness add a drop of water to the electrodes after they are disconnected from the unit and place on plastic sheet. If you continued to have difficulty, call the TENS unit company to purchase more electrodes. Do not apply lotion on the skin area prior to use. Make sure the skin is clean and dry as this will help prolong the life of the electrodes. After use, always check skin for unusual red areas, rash or other skin difficulties. If there are any skin problems, does not apply electrodes to the same area. Never remove the electrodes from the unit by pulling the wires. Do not use the TENS unit or electrodes other than as directed. Do not change electrode placement without consultating your therapist or physician. Keep 2 fingers with between each electrode. Wear time ratio is 2:1, on to off times.    For example on for 30 minutes off for 15 minutes and then on for 30 minutes off for 15 minutes

## 2024-09-14 NOTE — Progress Notes (Signed)
 "        I, Leotis Batter, CMA acting as a scribe for Artist Lloyd, MD.  Leon Carroll is a 49 y.o. male who presents to [facility] at [facility] today for exacerbation of his LBP. Pt was last seen by Dr. Leonce on 07/14/23 and was prescribed meloxicam  and taught HEP  Today, pt reports right-sided lower back pain initially, now having bilateral pain. Sx exacerbation over the past couple of months. Sx worse with bending at the waist. Tried changing mattress. Plays tennis, has been unable to play this week d/t sx. Feels tight and locked up. Hx of right knee injury managed by Dr. Yvone at [facility], needs surgery.   Radiating pain: occasionally into the gluteal region LE numbness/tingling: denies LE weakness: denies Aggravates: bending at the waist, getting in and out of the car Treatments tried: Tylenol-IBU dual, heat, Salonpas Patches, Icy Hot Patches, Meloxicam , Methocarbamol   Dx imaging: 07/14/23 L-spine XR  Pertinent review of systems: No fevers or chills  Relevant historical information: Hypertension   Exam:  BP 120/82   Pulse 79   Ht 6' 1 (1.854 m)   Wt 250 lb (113.4 kg)   SpO2 96%   BMI 32.98 kg/m  General: Well Developed, well nourished, and in no acute distress.   MSK: L-spine: Normal-appearing Nontender to palpation spinal midline. Decreased lumbar motion.  Lower extremity strength is intact.    Lab and Radiology Results   EXAM: LUMBAR SPINE - 2-3 VIEW   COMPARISON:  05/01/2014   FINDINGS: Five lumbar type vertebral bodies. Sacroiliac joints are symmetric. Maintenance of vertebral body height and alignment. Facet arthropathy at L4-5 and L5-S1. Loss of intervertebral disc height at L2-3 is mild.   IMPRESSION: Mild lumbar spondylosis, without acute osseous abnormality.     Electronically Signed   By: Rockey Kilts M.D.   On: 07/19/2023 12:10   I, Artist Lloyd, personally (independently) visualized and performed the  interpretation of the images attached in this note.     Assessment and Plan: 49 y.o. male with acute exacerbation of chronic low back pain.  Patient does have a little bit of degenerative changes on back x-ray from November 2024.  Today's pain is most consistent with lumbar spasm and dysfunction.  Plan for Toradol  and Depo-Medrol  injection followed by course of prednisone  and methocarbamol .  Will also refer to physical therapy.  If not improved consider advanced imaging.   PDMP not reviewed this encounter. Orders Placed This Encounter  Procedures   Ambulatory referral to Physical Therapy    Referral Priority:   Routine    Referral Type:   Physical Medicine    Referral Reason:   Specialty Services Required    Requested Specialty:   Physical Therapy    Number of Visits Requested:   1   Meds ordered this encounter  Medications   ketorolac  (TORADOL ) injection 60 mg   methylPREDNISolone  acetate (DEPO-MEDROL ) injection 80 mg   predniSONE  (DELTASONE ) 50 MG tablet    Sig: Take 1 tablet (50 mg total) by mouth daily with breakfast for 5 days.    Dispense:  5 tablet    Refill:  0   methocarbamol  (ROBAXIN ) 500 MG tablet    Sig: Take 1 tablet (500 mg total) by mouth at bedtime as needed for muscle spasms.    Dispense:  30 tablet    Refill:  2     Discussed warning signs or symptoms. Please see discharge instructions. Patient expresses understanding.  The above documentation has been reviewed and is accurate and complete Artist Lloyd, M.D.   "

## 2024-12-17 ENCOUNTER — Ambulatory Visit: Admitting: Dermatology
